# Patient Record
Sex: Female | Born: 1995 | Race: White | Hispanic: No | Smoking: Current every day smoker
Health system: Southern US, Community
[De-identification: ages and names within clinical notes are randomized; demographics above are authoritative.]

## PROBLEM LIST (undated history)

## (undated) DIAGNOSIS — R45851 Suicidal ideations: Secondary | ICD-10-CM

## (undated) DIAGNOSIS — F4010 Social phobia, unspecified: Secondary | ICD-10-CM

## (undated) DIAGNOSIS — F419 Anxiety disorder, unspecified: Secondary | ICD-10-CM

## (undated) DIAGNOSIS — Z8659 Personal history of other mental and behavioral disorders: Secondary | ICD-10-CM

## (undated) DIAGNOSIS — F4310 Post-traumatic stress disorder, unspecified: Secondary | ICD-10-CM

## (undated) HISTORY — PX: OTHER SURGICAL HISTORY: SHX169

## (undated) HISTORY — PX: TONSILLECTOMY: SUR1361

---
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
## 2016-01-04 ENCOUNTER — Ambulatory Visit (INDEPENDENT_AMBULATORY_CARE_PROVIDER_SITE_OTHER): Payer: Medicaid Other | Admitting: Psychiatry

## 2016-01-04 ENCOUNTER — Encounter (HOSPITAL_COMMUNITY): Payer: Self-pay | Admitting: Psychiatry

## 2016-01-04 VITALS — BP 115/66 | HR 73 | Ht 62.0 in | Wt 156.0 lb

## 2016-01-04 DIAGNOSIS — F4310 Post-traumatic stress disorder, unspecified: Secondary | ICD-10-CM

## 2016-01-04 DIAGNOSIS — Z813 Family history of other psychoactive substance abuse and dependence: Secondary | ICD-10-CM | POA: Diagnosis not present

## 2016-01-04 DIAGNOSIS — Z811 Family history of alcohol abuse and dependence: Secondary | ICD-10-CM | POA: Diagnosis not present

## 2016-01-04 DIAGNOSIS — Z79899 Other long term (current) drug therapy: Secondary | ICD-10-CM

## 2016-01-04 DIAGNOSIS — Z818 Family history of other mental and behavioral disorders: Secondary | ICD-10-CM | POA: Diagnosis not present

## 2016-01-04 DIAGNOSIS — F332 Major depressive disorder, recurrent severe without psychotic features: Secondary | ICD-10-CM | POA: Diagnosis not present

## 2016-01-04 MED ORDER — ARIPIPRAZOLE 2 MG PO TABS: 2.0000 mg | ORAL_TABLET | Freq: Every day | ORAL | 2 refills | Status: DC

## 2016-01-04 MED ORDER — SERTRALINE HCL 100 MG PO TABS: ORAL_TABLET | ORAL | 2 refills | Status: DC
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
## 2016-02-03 ENCOUNTER — Ambulatory Visit (HOSPITAL_COMMUNITY): Payer: Self-pay | Admitting: Psychiatry

## 2016-08-09 ENCOUNTER — Encounter (HOSPITAL_COMMUNITY): Payer: Self-pay | Admitting: Psychiatry

## 2016-08-09 ENCOUNTER — Telehealth (HOSPITAL_COMMUNITY): Payer: Self-pay | Admitting: *Deleted

## 2016-08-09 ENCOUNTER — Ambulatory Visit (INDEPENDENT_AMBULATORY_CARE_PROVIDER_SITE_OTHER): Payer: Medicaid Other | Admitting: Psychiatry

## 2016-08-09 VITALS — BP 108/62 | HR 64 | Resp 15 | Wt 149.0 lb

## 2016-08-09 DIAGNOSIS — F332 Major depressive disorder, recurrent severe without psychotic features: Secondary | ICD-10-CM

## 2016-08-09 DIAGNOSIS — Z818 Family history of other mental and behavioral disorders: Secondary | ICD-10-CM

## 2016-08-09 DIAGNOSIS — Z811 Family history of alcohol abuse and dependence: Secondary | ICD-10-CM

## 2016-08-09 DIAGNOSIS — Z813 Family history of other psychoactive substance abuse and dependence: Secondary | ICD-10-CM

## 2016-08-09 DIAGNOSIS — F4310 Post-traumatic stress disorder, unspecified: Secondary | ICD-10-CM | POA: Diagnosis not present

## 2016-08-09 MED ORDER — SERTRALINE HCL 100 MG PO TABS: ORAL_TABLET | ORAL | 2 refills | Status: DC

## 2016-08-09 MED ORDER — ARIPIPRAZOLE 2 MG PO TABS: 2.0000 mg | ORAL_TABLET | Freq: Every day | ORAL | 2 refills | Status: DC
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
## 2016-08-23 ENCOUNTER — Encounter (HOSPITAL_COMMUNITY): Payer: Self-pay | Admitting: Licensed Clinical Social Worker

## 2016-08-23 ENCOUNTER — Ambulatory Visit (INDEPENDENT_AMBULATORY_CARE_PROVIDER_SITE_OTHER): Payer: Medicaid Other | Admitting: Licensed Clinical Social Worker

## 2016-08-23 DIAGNOSIS — F4001 Agoraphobia with panic disorder: Secondary | ICD-10-CM | POA: Diagnosis not present

## 2016-08-23 DIAGNOSIS — F3130 Bipolar disorder, current episode depressed, mild or moderate severity, unspecified: Secondary | ICD-10-CM | POA: Diagnosis not present
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
## 2016-09-06 ENCOUNTER — Ambulatory Visit (INDEPENDENT_AMBULATORY_CARE_PROVIDER_SITE_OTHER): Payer: Medicaid Other | Admitting: Psychiatry

## 2016-09-06 ENCOUNTER — Encounter (HOSPITAL_COMMUNITY): Payer: Self-pay | Admitting: Psychiatry

## 2016-09-06 VITALS — BP 120/64 | HR 70 | Ht 62.0 in | Wt 159.8 lb

## 2016-09-06 DIAGNOSIS — F419 Anxiety disorder, unspecified: Secondary | ICD-10-CM

## 2016-09-06 DIAGNOSIS — Z811 Family history of alcohol abuse and dependence: Secondary | ICD-10-CM

## 2016-09-06 DIAGNOSIS — Z62811 Personal history of psychological abuse in childhood: Secondary | ICD-10-CM | POA: Diagnosis not present

## 2016-09-06 DIAGNOSIS — F332 Major depressive disorder, recurrent severe without psychotic features: Secondary | ICD-10-CM

## 2016-09-06 DIAGNOSIS — Z818 Family history of other mental and behavioral disorders: Secondary | ICD-10-CM

## 2016-09-06 DIAGNOSIS — F3130 Bipolar disorder, current episode depressed, mild or moderate severity, unspecified: Secondary | ICD-10-CM

## 2016-09-06 DIAGNOSIS — F4310 Post-traumatic stress disorder, unspecified: Secondary | ICD-10-CM

## 2016-09-06 DIAGNOSIS — Z813 Family history of other psychoactive substance abuse and dependence: Secondary | ICD-10-CM

## 2016-09-06 MED ORDER — ARIPIPRAZOLE 2 MG PO TABS: 2.0000 mg | ORAL_TABLET | Freq: Every day | ORAL | 2 refills | Status: DC

## 2016-09-06 MED ORDER — SERTRALINE HCL 100 MG PO TABS: ORAL_TABLET | ORAL | 2 refills | Status: DC
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
## 2016-09-15 ENCOUNTER — Ambulatory Visit (INDEPENDENT_AMBULATORY_CARE_PROVIDER_SITE_OTHER): Payer: Medicaid Other | Admitting: Licensed Clinical Social Worker

## 2016-09-15 DIAGNOSIS — F4001 Agoraphobia with panic disorder: Secondary | ICD-10-CM | POA: Diagnosis not present

## 2016-09-15 NOTE — Progress Notes (Signed)
   THERAPIST PROGRESS NOTE  Session Time: 11:00 am- 11:45 am  Participation Level: Active  Behavioral Response: CasualAlertAnxious  Type of Therapy: Family Therapy  Treatment Goals addressed: Anxiety  Interventions: CBT and Solution Focused  Summary: Kimberly Kidd is a 21 y.o. female who presents with Patient is a 21 year old Caucasian female that presents oriented x5 (person, place, situation, time and object), alert, anxious, casually dressed, well groomed, and cooperative to address mood and anxiety. Patient has limited medical history. She has a history of mental health treatment including outpatient therapy, medication management and hospitalization. Patient admits to several symptoms of mania inlcuding irritability, racing thoughts, increased sexual drive during highs, increased euphoria during highs, and increased confidence during her highs. Patient also reported a family history of Bipolar Disorder.  Patient denies current thoughts of suicide but admits to previous thoughts and attempts. She also admits to past self injurious behavior. Patient denies homicidal ideations. She denies psychosis inlcuding auditory and visual hallucinations. Patient denies substance abuse. She is at low risk for lethality at this time.  Patient had an average score of 3 out of 10 on the Outcome Rating Scale. Patient reported that her mood fluctuates and her anxiety has increased.  Patient reported that she doesn't like being around people but could not identify a reason. Patient had little insight into her anxiety or mood. After discussion, patient understood the connection between losing her father and 2 grandfathers in the same year when she was in 3rd grade to her current anxiety. Patient shared that she was at school and was called out of class to be told about her father. After that when got called of class she was really worried that something bad happened. Patient noted that she feels anxious being  away from her grandmother due to worry that something bad will happen to her. Patient could not identify what she is actually worried will happen to her grandmother. Patient reported that she tries to distract herself and count to 20 to deal with anxiety. After discussion, patient agreed to use deep breathing to reduce her anxiety. Patient committed to use deep breathing to reduce anxiety and pay attention to her thoughts that cause anxiety. Patient rated the session 7.5 out of 10 on the Session Rating Scale.   Patient engaged in session. Patient responded well to interventions. Patient continues to meet criteria for Bipolar I disorder, most recent episode depressed. Patient will continue in outpatient therapy due to being the least restrictive service to meet her needs. Patient made no progress on her goals at this time.   Suicidal/Homicidal: Negativewithout intent/plan  Therapist Response: Therapist reviewed patient's recent thoughts and behaviors. Therapist utilized CBT to address mood and anxiety. Therapist explored the roots of patient's anxiety. Therapist connected grief and loss that patient experienced to her current anxiety. Therapist had patient identify what helps her manage her anxiety. Therapist taught patient deep breathing to reduce anxiety. Therapist committed patient to use deep breathing to reduce anxiety and pay attention to thoughts that cause anxiety. Therapist administered the Outcome Rating Scale and the Session Rating Scale.   Plan: Return again in 2 weeks. Therapist will review patient goals on or before 10.25.2018.  Diagnosis: Axis I: Bipolar, Depressed and Panic Disorder    Axis II: No diagnosis    Bynum Bellows, LCSW 09/15/2016

## 2016-10-04 ENCOUNTER — Ambulatory Visit (INDEPENDENT_AMBULATORY_CARE_PROVIDER_SITE_OTHER): Payer: Medicaid Other | Admitting: Licensed Clinical Social Worker

## 2016-10-04 DIAGNOSIS — F3130 Bipolar disorder, current episode depressed, mild or moderate severity, unspecified: Secondary | ICD-10-CM

## 2016-10-04 DIAGNOSIS — F410 Panic disorder [episodic paroxysmal anxiety] without agoraphobia: Secondary | ICD-10-CM

## 2016-10-04 NOTE — Progress Notes (Signed)
   THERAPIST PROGRESS NOTE  Session Time: 11:00 am- 11:45 am  Participation Level: Active  Behavioral Response: CasualAlertAnxious  Type of Therapy: Family Therapy  Treatment Goals addressed: Anxiety  Interventions: CBT and Solution Focused  Summary: Kimberly BillingsKristen Kidd is a 21 y.o. female who presents with Patient is a 21 year old Caucasian female that presents oriented x5 (person, place, situation, time and object), alert, anxious, casually dressed, well groomed, and cooperative to address mood and anxiety. Patient has limited medical history. She has a history of mental health treatment including outpatient therapy, medication management and hospitalization. Patient admits to several symptoms of mania inlcuding irritability, racing thoughts, increased sexual drive during highs, increased euphoria during highs, and increased confidence during her highs. Patient also reported a family history of Bipolar Disorder.  Patient denies current thoughts of suicide but admits to previous thoughts and attempts. She also admits to past self injurious behavior. Patient denies homicidal ideations. She denies psychosis inlcuding auditory and visual hallucinations. Patient denies substance abuse. She is at low risk for lethality at this time.  Patient had an average score of 2.5 out of 10 on the Outcome Rating Scale. Patient reported that she has been experiencing an increase of anxiety and depression. Patient reported that her family, finances, situation and depression are triggers for her. Patient described what a good day looks like for her including showering, going for a walk, spending time in the woods, read and not stay in bed. After discussion, patient understood that she can only control her behaviors, she can't current control the finances, and she can't control her family.  Patient committed to manage her hygiene daily, go for a walk 3 times a week, [facility] [employer] to get new books to read and not stay  in bed. Patient rated the session 5.5 out of 10 on the Session Rating Scale which is a reduction of 2. Patient provided no feedback to what could be improved.   Patient engaged in session. Patient responded well to interventions. Patient continues to meet criteria for Bipolar I disorder, most recent episode depressed. Patient will continue in outpatient therapy due to being the least restrictive service to meet her needs. Patient made minimal progress on her goals at this time.   Suicidal/Homicidal: Negativewithout intent/plan  Therapist Response: Therapist reviewed patient's recent thoughts and behaviors. Therapist utilized CBT to address mood and anxiety. Therapist processed patient's feelings and increase in anxiety and depression to identify triggers.Therapist committed patient to manage her hygiene daily, go for a walk 3 times a week, go [facility] [employer] to get new books to read and not stay in bed. Therapist administered the Outcome Rating Scale and the Session Rating Scale.   Plan: Return again in 3 weeks. Therapist will review patient goals on or before 10.25.2018.  Diagnosis: Axis I: Bipolar, Depressed and Panic Disorder    Axis II: No diagnosis    Bynum BellowsJoshua Atthew Coutant, LCSW 10/04/2016

## 2016-10-12 ENCOUNTER — Ambulatory Visit: Payer: Self-pay | Admitting: Family Medicine

## 2016-10-17 ENCOUNTER — Ambulatory Visit (HOSPITAL_COMMUNITY): Payer: Medicaid Other | Admitting: Psychiatry

## 2016-10-25 ENCOUNTER — Ambulatory Visit (HOSPITAL_COMMUNITY): Payer: Medicaid Other | Admitting: Licensed Clinical Social Worker

## 2017-01-28 ENCOUNTER — Encounter (HOSPITAL_COMMUNITY): Payer: Self-pay

## 2017-01-28 ENCOUNTER — Emergency Department (HOSPITAL_COMMUNITY)
Admission: EM | Admit: 2017-01-28 | Discharge: 2017-01-28 | Disposition: A | Discharge status: Home / Self Care | Payer: Medicaid Other | Attending: Emergency Medicine | Admitting: Emergency Medicine

## 2017-01-28 ENCOUNTER — Other Ambulatory Visit: Payer: Self-pay

## 2017-01-28 ENCOUNTER — Emergency Department (HOSPITAL_COMMUNITY): Payer: Medicaid Other

## 2017-01-28 DIAGNOSIS — K5900 Constipation, unspecified: Secondary | ICD-10-CM | POA: Diagnosis not present

## 2017-01-28 DIAGNOSIS — R109 Unspecified abdominal pain: Secondary | ICD-10-CM | POA: Diagnosis present

## 2017-01-28 DIAGNOSIS — J45909 Unspecified asthma, uncomplicated: Secondary | ICD-10-CM | POA: Diagnosis not present

## 2017-01-28 LAB — CBC WITH DIFFERENTIAL/PLATELET
BASOS ABS: 0.1 10*3/uL (ref 0.0–0.1)
BASOS PCT: 1 %
EOS PCT: 1 %
Eosinophils Absolute: 0.1 10*3/uL (ref 0.0–0.7)
HCT: 44.4 % (ref 36.0–46.0)
Hemoglobin: 14.6 g/dL (ref 12.0–15.0)
Lymphocytes Relative: 34 %
Lymphs Abs: 2.3 10*3/uL (ref 0.7–4.0)
MCH: 28.5 pg (ref 26.0–34.0)
MCHC: 32.9 g/dL (ref 30.0–36.0)
MCV: 86.5 fL (ref 78.0–100.0)
MONO ABS: 0.6 10*3/uL (ref 0.1–1.0)
Monocytes Relative: 9 %
Neutro Abs: 3.8 10*3/uL (ref 1.7–7.7)
Neutrophils Relative %: 55 %
PLATELETS: 290 10*3/uL (ref 150–400)
RBC: 5.13 MIL/uL — ABNORMAL HIGH (ref 3.87–5.11)
RDW: 12.8 % (ref 11.5–15.5)
WBC: 6.8 10*3/uL (ref 4.0–10.5)

## 2017-01-28 LAB — URINALYSIS, ROUTINE W REFLEX MICROSCOPIC
BILIRUBIN URINE: NEGATIVE
Glucose, UA: NEGATIVE mg/dL
Hgb urine dipstick: NEGATIVE
Ketones, ur: NEGATIVE mg/dL
Leukocytes, UA: NEGATIVE
Nitrite: NEGATIVE
PROTEIN: NEGATIVE mg/dL
Specific Gravity, Urine: 1.01 (ref 1.005–1.030)
pH: 5 (ref 5.0–8.0)

## 2017-01-28 LAB — COMPREHENSIVE METABOLIC PANEL
ALBUMIN: 4.4 g/dL (ref 3.5–5.0)
ALT: 14 U/L (ref 14–54)
AST: 17 U/L (ref 15–41)
Alkaline Phosphatase: 51 U/L (ref 38–126)
Anion gap: 11 (ref 5–15)
BUN: 11 mg/dL (ref 6–20)
CHLORIDE: 105 mmol/L (ref 101–111)
CO2: 24 mmol/L (ref 22–32)
Calcium: 9.4 mg/dL (ref 8.9–10.3)
Creatinine, Ser: 0.64 mg/dL (ref 0.44–1.00)
GFR calc Af Amer: >60 mL/min (ref >60)
Glucose, Bld: 88 mg/dL (ref 65–99)
POTASSIUM: 3.9 mmol/L (ref 3.5–5.1)
Sodium: 140 mmol/L (ref 135–145)
Total Bilirubin: 1 mg/dL (ref 0.3–1.2)
Total Protein: 7.9 g/dL (ref 6.5–8.1)

## 2017-01-28 LAB — LIPASE, BLOOD: LIPASE: 29 U/L (ref 11–51)

## 2017-01-28 LAB — PREGNANCY, URINE: Preg Test, Ur: NEGATIVE

## 2017-01-28 MED ORDER — DICYCLOMINE HCL 20 MG PO TABS: 20.0000 mg | ORAL_TABLET | Freq: Two times a day (BID) | ORAL | 0 refills | Status: DC | PRN

## 2017-01-28 MED ORDER — POLYETHYLENE GLYCOL 3350 17 G PO PACK: 17.0000 g | PACK | Freq: Every day | ORAL | 0 refills | Status: DC

## 2017-01-28 MED ORDER — DOCUSATE SODIUM 100 MG PO CAPS: 100.0000 mg | ORAL_CAPSULE | Freq: Every day | ORAL | 0 refills | Status: DC

## 2017-01-28 NOTE — ED Triage Notes (Signed)
Patient complains of lower abdominal cramping since October but worse today. Also reports of spotting blood in November but no regular period since October 25th. Has taken 3 home pregnancy that were negative.

## 2017-01-28 NOTE — ED Provider Notes (Signed)
[facility] EMERGENCY DEPARTMENT Provider Note   CSN: [id number] Arrival date & time: 01/28/17  1317     History   Chief Complaint Chief Complaint  Patient presents with  . Abdominal Pain    HPI Kimberly Kidd is a 21 y.o. female.  HPI Patient presents with several months of episodic abdominal cramping.  Worse in the last few days.  Patient also states she has had difficulty having bowel movements.  She has not had a regular.  Since October.  She did have some spotting in November.  Denies current vaginal bleeding or discharge.  Has taken several home pregnancy tests that were negative.  Denies any nausea or vomiting.  No fever or chills. Past Medical History:  Diagnosis Date  . Anxiety   . Asthma   . Hx of eating disorder 11/06/2014  . Post traumatic stress disorder (PTSD) 11/06/2014  . Social anxiety disorder 11/06/2014  . Suicidal ideation 11/06/2014    Patient Active Problem List   Diagnosis Date Noted  . MDD (major depressive disorder), recurrent episode, severe (HCC) 11/06/2014  . Social anxiety disorder 11/06/2014  . Post traumatic stress disorder (PTSD) 11/06/2014  . Hx of eating disorder 11/06/2014  . Suicidal ideation 11/06/2014  . Recurrent major depression-severe (HCC) 12/23/2010  . Stress disorder, acute 12/23/2010  . Generalized anxiety disorder 12/23/2010    History reviewed. No pertinent surgical history.  OB History    No data available       Home Medications    Prior to Admission medications   Medication Sig Start Date End Date Taking? Authorizing Provider  acetaminophen (TYLENOL) 325 MG tablet Take 650 mg by mouth every 6 (six) hours as needed. For headache    Yes [provider]  ARIPiprazole (ABILIFY) 2 MG tablet Take 1 tablet (2 mg total) by mouth at bedtime. Patient not taking: Reported on 01/28/2017 09/06/16   Myrlene Brokeross, Deborah R, MD  dicyclomine (BENTYL) 20 MG tablet Take 1 tablet (20 mg total) by mouth 2 (two) times daily as needed  for spasms. 01/28/17   Loren RacerYelverton, Morgana Rowley, MD  docusate sodium (COLACE) 100 MG capsule Take 1 capsule (100 mg total) by mouth daily. 01/28/17   Loren RacerYelverton, Evian Salguero, MD  polyethylene glycol [facility](MIRALAX / Ethelene HalGLYCOLAX) packet Take 17 g by mouth daily. 01/28/17   Loren RacerYelverton, Torra Pala, MD    Family History Family History  Problem Relation Age of Onset  . Schizophrenia Maternal Grandfather   . Drug abuse Maternal Grandfather   . Alcohol abuse Maternal Grandfather   . Drug abuse Mother   . Bipolar disorder Mother   . Alcohol abuse Father   . Bipolar disorder Maternal Aunt   . Schizophrenia Maternal Uncle     Social History Social History   Tobacco Use  . Smoking status: Never Smoker  . Smokeless tobacco: Never Used  Substance Use Topics  . Alcohol use: No  . Drug use: No     Allergies   Patient has no known allergies.   Review of Systems Review of Systems  Constitutional: Negative for chills and fever.  Respiratory: Negative for cough and shortness of breath.   Cardiovascular: Negative for chest pain.  Gastrointestinal: Positive for abdominal pain and constipation. Negative for diarrhea, nausea and vomiting.  Genitourinary: Negative for dysuria, flank pain, frequency, hematuria, pelvic pain, vaginal bleeding and vaginal discharge.  Musculoskeletal: Negative for back pain, myalgias, neck pain and neck stiffness.  Skin: Negative for rash and wound.  Neurological: Negative for dizziness, weakness, light-headedness, numbness  and headaches.  All other systems reviewed and are negative.    Physical Exam Updated Vital Signs BP 119/68 (BP Location: Right Arm)   Pulse 96   Temp 98.9 F (37.2 C) (Oral)   Resp 18   Ht 5\' 3"  (1.6 m)   Wt 67.3 kg (148 lb 6 oz)   LMP 11/23/2016   SpO2 100%   BMI 26.28 kg/m   Physical Exam  Constitutional: She is oriented to person, place, and time. She appears well-developed and well-nourished.  Non-toxic appearance. No distress.  HENT:  Head:  Normocephalic and atraumatic.  Mouth/Throat: Oropharynx is clear and moist.  Eyes: EOM are normal. Pupils are equal, round, and reactive to light.  Neck: Normal range of motion. Neck supple.  Cardiovascular: Normal rate and regular rhythm.  Pulmonary/Chest: Effort normal and breath sounds normal.  Abdominal: Soft. Bowel sounds are normal. There is tenderness. There is no rebound and no guarding.  Mild tenderness to palpation in the epigastrum and right lower quadrant.  There is no rebound or guarding.  Musculoskeletal: Normal range of motion. She exhibits no edema or tenderness.  No CVA tenderness bilaterally.  Neurological: She is alert and oriented to person, place, and time.  Moves all extremities without deficit.  Sensation fully intact.  Skin: Skin is warm and dry. Capillary refill takes less than 2 seconds. No rash noted. She is not diaphoretic. No erythema.  Psychiatric: She has a normal mood and affect. Her behavior is normal.  Nursing note and vitals reviewed.    ED Treatments / Results  Labs (all labs ordered are listed, but only abnormal results are displayed) Labs Reviewed  CBC WITH DIFFERENTIAL/PLATELET - Abnormal; Notable for the following components:      Result Value   RBC 5.13 (*)    All other components within normal limits  URINALYSIS, ROUTINE W REFLEX MICROSCOPIC  PREGNANCY, URINE  COMPREHENSIVE METABOLIC PANEL  LIPASE, BLOOD    EKG  EKG Interpretation None       Radiology Dg Abdomen 1 View  Result Date: 01/28/2017 CLINICAL DATA:  Lower abdominal pain for several months. EXAM: ABDOMEN - 1 VIEW COMPARISON:  10/31/2005. FINDINGS: The bowel gas pattern is normal. No radio-opaque calculi or other significant radiographic abnormality are seen. IMPRESSION: Negative. Electronically Signed   By: Kennith CenterEric  Mansell M.D.   On: 01/28/2017 17:29    Procedures Procedures (including critical care time)  Medications Ordered in ED Medications - No data to  display   Initial Impression / Assessment and Plan / ED Course  I have reviewed the triage vital signs and the nursing notes.  Pertinent labs & imaging results that were available during my care of the patient were reviewed by me and considered in my medical decision making (see chart for details).     Abdominal exam is benign.  Low suspicion for appendicitis.  Normal white blood cell count.  X-ray showed moderate amount of stool in the ascending colon.  This is area where she is most tender.  Suspect constipation.  Will start on bowel regimen and have follow-up closely with her primary physician.  Strict return precautions given.  Final Clinical Impressions(s) / ED Diagnoses   Final diagnoses:  Constipation, unspecified constipation type  Abdominal cramping    ED Discharge Orders        Ordered    polyethylene glycol (MIRALAX / GLYCOLAX) packet  Daily     01/28/17 1803    docusate sodium (COLACE) 100 MG capsule  Daily  01/28/17 1803    dicyclomine (BENTYL) 20 MG tablet  2 times daily PRN     01/28/17 1803       Loren Racer, MD 01/28/17 [phone]

## 2017-08-03 ENCOUNTER — Encounter (HOSPITAL_COMMUNITY): Payer: Self-pay | Admitting: *Deleted

## 2017-08-03 ENCOUNTER — Other Ambulatory Visit: Payer: Self-pay

## 2017-08-03 ENCOUNTER — Emergency Department (HOSPITAL_COMMUNITY)
Admission: EM | Admit: 2017-08-03 | Discharge: 2017-08-03 | Disposition: A | Discharge status: Home / Self Care | Payer: Medicaid Other | Attending: Emergency Medicine | Admitting: Emergency Medicine

## 2017-08-03 DIAGNOSIS — N3001 Acute cystitis with hematuria: Secondary | ICD-10-CM

## 2017-08-03 DIAGNOSIS — J45909 Unspecified asthma, uncomplicated: Secondary | ICD-10-CM | POA: Insufficient documentation

## 2017-08-03 DIAGNOSIS — R1084 Generalized abdominal pain: Secondary | ICD-10-CM | POA: Diagnosis present

## 2017-08-03 LAB — URINALYSIS, ROUTINE W REFLEX MICROSCOPIC
BILIRUBIN URINE: NEGATIVE
Bacteria, UA: NONE SEEN
Glucose, UA: NEGATIVE mg/dL
KETONES UR: 5 mg/dL — AB
Nitrite: NEGATIVE
PH: 5 (ref 5.0–8.0)
Protein, ur: NEGATIVE mg/dL
SPECIFIC GRAVITY, URINE: 1.01 (ref 1.005–1.030)

## 2017-08-03 LAB — CBC
HCT: 42.5 % (ref 36.0–46.0)
HEMOGLOBIN: 14.2 g/dL (ref 12.0–15.0)
MCH: 29.1 pg (ref 26.0–34.0)
MCHC: 33.4 g/dL (ref 30.0–36.0)
MCV: 87.1 fL (ref 78.0–100.0)
PLATELETS: 258 10*3/uL (ref 150–400)
RBC: 4.88 MIL/uL (ref 3.87–5.11)
RDW: 12.6 % (ref 11.5–15.5)
WBC: 13.5 10*3/uL — ABNORMAL HIGH (ref 4.0–10.5)

## 2017-08-03 LAB — COMPREHENSIVE METABOLIC PANEL
ALK PHOS: 50 U/L (ref 38–126)
ALT: 12 U/L (ref 0–44)
ANION GAP: 11 (ref 5–15)
AST: 16 U/L (ref 15–41)
Albumin: 4.1 g/dL (ref 3.5–5.0)
BILIRUBIN TOTAL: 1.2 mg/dL (ref 0.3–1.2)
BUN: 10 mg/dL (ref 6–20)
CALCIUM: 9.2 mg/dL (ref 8.9–10.3)
CO2: 24 mmol/L (ref 22–32)
Chloride: 104 mmol/L (ref 98–111)
Creatinine, Ser: 0.71 mg/dL (ref 0.44–1.00)
Glucose, Bld: 86 mg/dL (ref 70–99)
Potassium: 4 mmol/L (ref 3.5–5.1)
Sodium: 139 mmol/L (ref 135–145)
TOTAL PROTEIN: 7.9 g/dL (ref 6.5–8.1)

## 2017-08-03 LAB — LIPASE, BLOOD: Lipase: 25 U/L (ref 11–51)

## 2017-08-03 LAB — HCG, QUANTITATIVE, PREGNANCY: hCG, Beta Chain, Quant, S: <1 m[IU]/mL (ref <5)

## 2017-08-03 MED ORDER — CEPHALEXIN 500 MG PO CAPS: 500.0000 mg | ORAL_CAPSULE | Freq: Four times a day (QID) | ORAL | 0 refills | Status: DC

## 2017-08-03 MED ORDER — SODIUM CHLORIDE 0.9 % IV BOLUS
1000.0000 mL | Freq: Once | INTRAVENOUS | Status: AC
  Administered 2017-08-03: 1000 mL via INTRAVENOUS

## 2017-08-03 MED ORDER — SODIUM CHLORIDE 0.9 % IV SOLN
1.0000 g | Freq: Once | INTRAVENOUS | Status: AC
  Administered 2017-08-03: 1 g via INTRAVENOUS
  Filled 2017-08-03: qty 10

## 2017-08-03 MED ORDER — IBUPROFEN 800 MG PO TABS: 800.0000 mg | ORAL_TABLET | Freq: Three times a day (TID) | ORAL | 0 refills | Status: DC | PRN

## 2017-08-03 MED ORDER — SERTRALINE HCL 100 MG PO TABS: 100.0000 mg | ORAL_TABLET | Freq: Every day | ORAL | 0 refills | Status: DC

## 2017-08-03 NOTE — ED Provider Notes (Signed)
[facility] EMERGENCY DEPARTMENT Provider Note   CSN: [id number] Arrival date & time: 08/03/17  1505     History   Chief Complaint Chief Complaint  Patient presents with  . Abdominal Pain    HPI Kimberly Kidd is a 22 y.o. female.  Patient complains of lower abdominal pain.  She also states she would like to get back on her Zoloft for anxiety  The history is provided by the patient. No language interpreter was used.  Abdominal Pain   This is a new problem. The current episode started 2 days ago. The problem occurs constantly. The problem has not changed since onset.The pain is associated with an unknown factor. The pain is located in the generalized abdominal region. The quality of the pain is aching. The pain is at a severity of 5/10. The pain is moderate. Pertinent negatives include anorexia, diarrhea, frequency, hematuria and headaches. Nothing aggravates the symptoms.    Past Medical History:  Diagnosis Date  . Anxiety   . Asthma   . Hx of eating disorder 11/06/2014  . Post traumatic stress disorder (PTSD) 11/06/2014  . Social anxiety disorder 11/06/2014  . Suicidal ideation 11/06/2014    Patient Active Problem List   Diagnosis Date Noted  . MDD (major depressive disorder), recurrent episode, severe (HCC) 11/06/2014  . Social anxiety disorder 11/06/2014  . Post traumatic stress disorder (PTSD) 11/06/2014  . Hx of eating disorder 11/06/2014  . Suicidal ideation 11/06/2014  . Recurrent major depression-severe (HCC) 12/23/2010  . Stress disorder, acute 12/23/2010  . Generalized anxiety disorder 12/23/2010    History reviewed. No pertinent surgical history.   OB History   None      Home Medications    Prior to Admission medications   Medication Sig Start Date End Date Taking? Authorizing Provider  ARIPiprazole (ABILIFY) 2 MG tablet Take 1 tablet (2 mg total) by mouth at bedtime. Patient not taking: Reported on 01/28/2017 09/06/16   Myrlene Broker, MD    cephALEXin (KEFLEX) 500 MG capsule Take 1 capsule (500 mg total) by mouth 4 (four) times daily. 08/03/17   Bethann Berkshire, MD  ibuprofen (ADVIL,MOTRIN) 800 MG tablet Take 1 tablet (800 mg total) by mouth every 8 (eight) hours as needed for moderate pain. 08/03/17   Bethann Berkshire, MD  sertraline (ZOLOFT) 100 MG tablet Take 1 tablet (100 mg total) by mouth daily. 08/03/17   Bethann Berkshire, MD    Family History Family History  Problem Relation Age of Onset  . Schizophrenia Maternal Grandfather   . Drug abuse Maternal Grandfather   . Alcohol abuse Maternal Grandfather   . Drug abuse Mother   . Bipolar disorder Mother   . Alcohol abuse Father   . Bipolar disorder Maternal Aunt   . Schizophrenia Maternal Uncle     Social History Social History   Tobacco Use  . Smoking status: Never Smoker  . Smokeless tobacco: Never Used  Substance Use Topics  . Alcohol use: No  . Drug use: No    Types: Benzodiazepines     Allergies   Patient has no known allergies.   Review of Systems Review of Systems  Constitutional: Negative for appetite change and fatigue.  HENT: Negative for congestion, ear discharge and sinus pressure.   Eyes: Negative for discharge.  Respiratory: Negative for cough.   Cardiovascular: Negative for chest pain.  Gastrointestinal: Positive for abdominal pain. Negative for anorexia and diarrhea.  Genitourinary: Negative for frequency and hematuria.  Musculoskeletal: Negative for back  pain.  Skin: Negative for rash.  Neurological: Negative for seizures and headaches.  Psychiatric/Behavioral: Negative for hallucinations.     Physical Exam Updated Vital Signs BP 119/74   Pulse (!) 112   Temp 99.4 F (37.4 C)   Resp 20   Ht 5\' 3"  (1.6 m)   Wt 65.8 kg (145 lb)   LMP  (LMP Unknown)   SpO2 98%   BMI 25.69 kg/m   Physical Exam  Constitutional: She is oriented to person, place, and time. She appears well-developed.  HENT:  Head: Normocephalic.  Eyes: Conjunctivae  and EOM are normal. No scleral icterus.  Neck: Neck supple. No thyromegaly present.  Cardiovascular: Normal rate and regular rhythm. Exam reveals no gallop and no friction rub.  No murmur heard. Pulmonary/Chest: No stridor. She has no wheezes. She has no rales. She exhibits no tenderness.  Abdominal: She exhibits no distension. There is tenderness. There is no rebound.  Musculoskeletal: Normal range of motion. She exhibits no edema.  Lymphadenopathy:    She has no cervical adenopathy.  Neurological: She is oriented to person, place, and time. She exhibits normal muscle tone. Coordination normal.  Skin: No rash noted. No erythema.  Psychiatric: She has a normal mood and affect. Her behavior is normal.     ED Treatments / Results  Labs (all labs ordered are listed, but only abnormal results are displayed) Labs Reviewed  CBC - Abnormal; Notable for the following components:      Result Value   WBC 13.5 (*)    All other components within normal limits  URINALYSIS, ROUTINE W REFLEX MICROSCOPIC - Abnormal; Notable for the following components:   APPearance HAZY (*)    Hgb urine dipstick MODERATE (*)    Ketones, ur 5 (*)    Leukocytes, UA SMALL (*)    WBC, UA >50 (*)    All other components within normal limits  URINE CULTURE  LIPASE, BLOOD  COMPREHENSIVE METABOLIC PANEL  HCG, QUANTITATIVE, PREGNANCY    EKG None  Radiology No results found.  Procedures Procedures (including critical care time)  Medications Ordered in ED Medications  sodium chloride 0.9 % bolus 1,000 mL (0 mLs Intravenous Stopped 08/03/17 1759)  cefTRIAXone (ROCEPHIN) 1 g in sodium chloride 0.9 % 100 mL IVPB (0 g Intravenous Stopped 08/03/17 1850)     Initial Impression / Assessment and Plan / ED Course  I have reviewed the triage vital signs and the nursing notes.  Pertinent labs & imaging results that were available during my care of the patient were reviewed by me and considered in my medical decision  making (see chart for details).     Patient's urinalysis is consistent with urinary tract infection.  She will be placed on Keflex and given Zoloft which she was on before.  She will follow-up with OB/GYN next week  Final Clinical Impressions(s) / ED Diagnoses   Final diagnoses:  Acute cystitis with hematuria    ED Discharge Orders        Ordered    cephALEXin (KEFLEX) 500 MG capsule  4 times daily     08/03/17 1910    sertraline (ZOLOFT) 100 MG tablet  Daily     08/03/17 1910    ibuprofen (ADVIL,MOTRIN) 800 MG tablet  Every 8 hours PRN     08/03/17 1911       Bethann BerkshireZammit, Liyat Faulkenberry, MD 08/03/17 1914

## 2017-08-03 NOTE — Discharge Instructions (Addendum)
Follow-up with Dr. Emelda FearFerguson or 1 of his partners next week call for an appointment

## 2017-08-03 NOTE — ED Notes (Signed)
When this nurse called for pt to transport to room, pt was eating food from [employer].  Explained to pt that she is not to have anything to eat or drink til seen and ok'd by [name].  Pt states she hasn't eaten all day.

## 2017-08-03 NOTE — ED Triage Notes (Signed)
Pt with abd pain with N/V and constipation since Tuesday, LBM Tuesday or Wednesday.  Emesis x1 this morning. Unknown of last menses.

## 2017-08-06 LAB — URINE CULTURE

## 2017-08-07 ENCOUNTER — Telehealth: Payer: Self-pay | Admitting: Emergency Medicine

## 2017-08-07 NOTE — Telephone Encounter (Signed)
Post ED Visit - Positive Culture Follow-up  Culture report reviewed by antimicrobial stewardship pharmacist:  []  Kimberly Kidd, Pharm.D. []  Kimberly Kidd, Pharm.D., BCPS AQ-ID []  Kimberly Kidd, Pharm.D., BCPS []  Kimberly Kidd, Pharm.D., BCPS []  Kimberly Kidd, [address]Pharm[address]D., BCPS, AAHIVP []  Kimberly Kidd, Pharm.D., BCPS, AAHIVP []  Kimberly Kidd, PharmD, BCPS []  Kimberly Kidd, PharmD, BCPS [x]  Kimberly Kidd, PharmD, BCPS []  Kimberly Kidd, PharmD  Positive urine culture Treated with cephalexin, organism sensitive to the same and no further patient follow-up is required at this time.  Kimberly Kidd, Kimberly Kidd 08/07/2017, 1:25 PM

## 2017-10-11 ENCOUNTER — Emergency Department (HOSPITAL_COMMUNITY)
Admission: EM | Admit: 2017-10-11 | Discharge: 2017-10-11 | Disposition: A | Discharge status: Home / Self Care | Payer: Medicaid Other | Attending: Emergency Medicine | Admitting: Emergency Medicine

## 2017-10-11 ENCOUNTER — Emergency Department (HOSPITAL_COMMUNITY): Payer: Medicaid Other

## 2017-10-11 ENCOUNTER — Other Ambulatory Visit: Payer: Self-pay

## 2017-10-11 ENCOUNTER — Encounter (HOSPITAL_COMMUNITY): Payer: Self-pay

## 2017-10-11 ENCOUNTER — Inpatient Hospital Stay (HOSPITAL_COMMUNITY)
Admission: AD | Admit: 2017-10-11 | Discharge: 2017-10-15 | DRG: 885 | Disposition: A | Discharge status: Home / Self Care | Payer: Medicaid Other | Source: Intra-hospital | Attending: Psychiatry | Admitting: Psychiatry

## 2017-10-11 DIAGNOSIS — G4700 Insomnia, unspecified: Secondary | ICD-10-CM | POA: Diagnosis not present

## 2017-10-11 DIAGNOSIS — X80XXXA Intentional self-harm by jumping from a high place, initial encounter: Secondary | ICD-10-CM | POA: Diagnosis not present

## 2017-10-11 DIAGNOSIS — Z23 Encounter for immunization: Secondary | ICD-10-CM | POA: Insufficient documentation

## 2017-10-11 DIAGNOSIS — J45909 Unspecified asthma, uncomplicated: Secondary | ICD-10-CM | POA: Insufficient documentation

## 2017-10-11 DIAGNOSIS — Z818 Family history of other mental and behavioral disorders: Secondary | ICD-10-CM | POA: Diagnosis not present

## 2017-10-11 DIAGNOSIS — T1491XA Suicide attempt, initial encounter: Secondary | ICD-10-CM | POA: Diagnosis present

## 2017-10-11 DIAGNOSIS — Z630 Problems in relationship with spouse or partner: Secondary | ICD-10-CM | POA: Diagnosis not present

## 2017-10-11 DIAGNOSIS — Y999 Unspecified external cause status: Secondary | ICD-10-CM | POA: Insufficient documentation

## 2017-10-11 DIAGNOSIS — F1290 Cannabis use, unspecified, uncomplicated: Secondary | ICD-10-CM | POA: Diagnosis not present

## 2017-10-11 DIAGNOSIS — R1030 Lower abdominal pain, unspecified: Secondary | ICD-10-CM | POA: Insufficient documentation

## 2017-10-11 DIAGNOSIS — Z813 Family history of other psychoactive substance abuse and dependence: Secondary | ICD-10-CM | POA: Diagnosis not present

## 2017-10-11 DIAGNOSIS — F4310 Post-traumatic stress disorder, unspecified: Secondary | ICD-10-CM | POA: Diagnosis present

## 2017-10-11 DIAGNOSIS — F419 Anxiety disorder, unspecified: Secondary | ICD-10-CM | POA: Diagnosis not present

## 2017-10-11 DIAGNOSIS — Z8659 Personal history of other mental and behavioral disorders: Secondary | ICD-10-CM | POA: Diagnosis not present

## 2017-10-11 DIAGNOSIS — F332 Major depressive disorder, recurrent severe without psychotic features: Secondary | ICD-10-CM | POA: Insufficient documentation

## 2017-10-11 DIAGNOSIS — X838XXA Intentional self-harm by other specified means, initial encounter: Secondary | ICD-10-CM | POA: Insufficient documentation

## 2017-10-11 DIAGNOSIS — F411 Generalized anxiety disorder: Secondary | ICD-10-CM | POA: Diagnosis present

## 2017-10-11 DIAGNOSIS — Y9389 Activity, other specified: Secondary | ICD-10-CM | POA: Insufficient documentation

## 2017-10-11 DIAGNOSIS — F329 Major depressive disorder, single episode, unspecified: Secondary | ICD-10-CM | POA: Diagnosis present

## 2017-10-11 DIAGNOSIS — Y92410 Unspecified street and highway as the place of occurrence of the external cause: Secondary | ICD-10-CM | POA: Diagnosis not present

## 2017-10-11 DIAGNOSIS — R45851 Suicidal ideations: Secondary | ICD-10-CM | POA: Diagnosis not present

## 2017-10-11 DIAGNOSIS — Z79899 Other long term (current) drug therapy: Secondary | ICD-10-CM

## 2017-10-11 DIAGNOSIS — F17210 Nicotine dependence, cigarettes, uncomplicated: Secondary | ICD-10-CM | POA: Diagnosis present

## 2017-10-11 DIAGNOSIS — Z560 Unemployment, unspecified: Secondary | ICD-10-CM | POA: Diagnosis not present

## 2017-10-11 DIAGNOSIS — Z811 Family history of alcohol abuse and dependence: Secondary | ICD-10-CM | POA: Diagnosis not present

## 2017-10-11 LAB — URINALYSIS, ROUTINE W REFLEX MICROSCOPIC
Bacteria, UA: NONE SEEN
Bilirubin Urine: NEGATIVE
GLUCOSE, UA: NEGATIVE mg/dL
KETONES UR: NEGATIVE mg/dL
NITRITE: NEGATIVE
PH: 5 (ref 5.0–8.0)
PROTEIN: NEGATIVE mg/dL
Specific Gravity, Urine: >1.046 — ABNORMAL HIGH (ref 1.005–1.030)

## 2017-10-11 LAB — COMPREHENSIVE METABOLIC PANEL
ALBUMIN: 4.1 g/dL (ref 3.5–5.0)
ALT: 16 U/L (ref 0–44)
ANION GAP: 7 (ref 5–15)
AST: 21 U/L (ref 15–41)
Alkaline Phosphatase: 47 U/L (ref 38–126)
BUN: 11 mg/dL (ref 6–20)
CHLORIDE: 108 mmol/L (ref 98–111)
CO2: 24 mmol/L (ref 22–32)
Calcium: 9 mg/dL (ref 8.9–10.3)
Creatinine, Ser: 0.85 mg/dL (ref 0.44–1.00)
GFR calc Af Amer: >60 mL/min (ref >60)
Glucose, Bld: 98 mg/dL (ref 70–99)
POTASSIUM: 3.7 mmol/L (ref 3.5–5.1)
Sodium: 139 mmol/L (ref 135–145)
Total Bilirubin: 1.2 mg/dL (ref 0.3–1.2)
Total Protein: 7 g/dL (ref 6.5–8.1)

## 2017-10-11 LAB — CBC WITH DIFFERENTIAL/PLATELET
BASOS PCT: 0 %
Basophils Absolute: 0 10*3/uL (ref 0.0–0.1)
EOS PCT: 0 %
Eosinophils Absolute: 0.1 10*3/uL (ref 0.0–0.7)
HEMATOCRIT: 41.3 % (ref 36.0–46.0)
Hemoglobin: 13.8 g/dL (ref 12.0–15.0)
Lymphocytes Relative: 10 %
Lymphs Abs: 1.2 10*3/uL (ref 0.7–4.0)
MCH: 28.6 pg (ref 26.0–34.0)
MCHC: 33.4 g/dL (ref 30.0–36.0)
MCV: 85.7 fL (ref 78.0–100.0)
MONO ABS: 0.6 10*3/uL (ref 0.1–1.0)
MONOS PCT: 5 %
NEUTROS ABS: 10.6 10*3/uL — AB (ref 1.7–7.7)
Neutrophils Relative %: 85 %
PLATELETS: 257 10*3/uL (ref 150–400)
RBC: 4.82 MIL/uL (ref 3.87–5.11)
RDW: 12.7 % (ref 11.5–15.5)
WBC: 12.4 10*3/uL — ABNORMAL HIGH (ref 4.0–10.5)

## 2017-10-11 LAB — RAPID URINE DRUG SCREEN, HOSP PERFORMED
Amphetamines: NOT DETECTED
BARBITURATES: NOT DETECTED
Benzodiazepines: NOT DETECTED
Cocaine: POSITIVE — AB
Opiates: NOT DETECTED
Tetrahydrocannabinol: NOT DETECTED

## 2017-10-11 LAB — TYPE AND SCREEN
ABO/RH(D): A POS
Antibody Screen: NEGATIVE

## 2017-10-11 LAB — ETHANOL

## 2017-10-11 LAB — PROTIME-INR
INR: 1.1
Prothrombin Time: 14.1 seconds (ref 11.4–15.2)

## 2017-10-11 LAB — I-STAT BETA HCG BLOOD, ED (MC, WL, AP ONLY)

## 2017-10-11 MED ORDER — BACITRACIN ZINC 500 UNIT/GM EX OINT
1.0000 "application " | TOPICAL_OINTMENT | Freq: Two times a day (BID) | CUTANEOUS | Status: DC
  Administered 2017-10-11: 1 via TOPICAL
  Filled 2017-10-11: qty 3.6

## 2017-10-11 MED ORDER — IBUPROFEN 800 MG PO TABS
800.0000 mg | ORAL_TABLET | Freq: Three times a day (TID) | ORAL | Status: DC
  Filled 2017-10-11: qty 1

## 2017-10-11 MED ORDER — TETANUS-DIPHTH-ACELL PERTUSSIS 5-2.5-18.5 LF-MCG/0.5 IM SUSP
0.5000 mL | Freq: Once | INTRAMUSCULAR | Status: AC
  Administered 2017-10-11: 0.5 mL via INTRAMUSCULAR
  Filled 2017-10-11: qty 0.5

## 2017-10-11 MED ORDER — TRAMADOL HCL 50 MG PO TABS
50.0000 mg | ORAL_TABLET | Freq: Two times a day (BID) | ORAL | Status: DC | PRN
  Administered 2017-10-12: 50 mg via ORAL
  Filled 2017-10-11: qty 1

## 2017-10-11 MED ORDER — FENTANYL CITRATE (PF) 100 MCG/2ML IJ SOLN
50.0000 ug | Freq: Once | INTRAMUSCULAR | Status: AC
  Administered 2017-10-11: 50 ug via INTRAVENOUS
  Filled 2017-10-11: qty 2

## 2017-10-11 MED ORDER — IBUPROFEN 800 MG PO TABS
800.0000 mg | ORAL_TABLET | Freq: Three times a day (TID) | ORAL | Status: AC
  Administered 2017-10-12 – 2017-10-14 (×8): 800 mg via ORAL
  Filled 2017-10-11 (×12): qty 1

## 2017-10-11 MED ORDER — IOPAMIDOL (ISOVUE-300) INJECTION 61%
100.0000 mL | Freq: Once | INTRAVENOUS | Status: AC | PRN
  Administered 2017-10-11: 100 mL via INTRAVENOUS

## 2017-10-11 MED ORDER — HYDROCODONE-ACETAMINOPHEN 5-325 MG PO TABS
2.0000 | ORAL_TABLET | Freq: Once | ORAL | Status: AC
  Administered 2017-10-11: 2 via ORAL
  Filled 2017-10-11: qty 2

## 2017-10-11 MED ORDER — BACITRACIN ZINC 500 UNIT/GM EX OINT
1.0000 "application " | TOPICAL_OINTMENT | Freq: Two times a day (BID) | CUTANEOUS | Status: DC
  Administered 2017-10-12 – 2017-10-15 (×6): 1 via TOPICAL
  Filled 2017-10-11 (×2): qty 28.35

## 2017-10-11 NOTE — ED Notes (Signed)
Pt has been accepted at [facility] bed [facility], accepting physician is Dr. Jola Babinskilary.  Waiting for IVC papers to be served before pt can transported to [facility].  CN and Diplomatic Services operational officersecretary are aware.
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
## 2017-10-11 NOTE — ED Notes (Signed)
Reinforced some of the dressings placed today.
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
## 2017-10-11 NOTE — ED Notes (Signed)
Still waiting for IVC paperwork to be served by [facility].
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
## 2017-10-12 ENCOUNTER — Other Ambulatory Visit: Payer: Self-pay

## 2017-10-12 ENCOUNTER — Encounter (HOSPITAL_COMMUNITY): Payer: Self-pay | Admitting: *Deleted

## 2017-10-12 DIAGNOSIS — Z818 Family history of other mental and behavioral disorders: Secondary | ICD-10-CM

## 2017-10-12 DIAGNOSIS — F332 Major depressive disorder, recurrent severe without psychotic features: Principal | ICD-10-CM

## 2017-10-12 DIAGNOSIS — F17210 Nicotine dependence, cigarettes, uncomplicated: Secondary | ICD-10-CM

## 2017-10-12 DIAGNOSIS — F419 Anxiety disorder, unspecified: Secondary | ICD-10-CM

## 2017-10-12 DIAGNOSIS — Z811 Family history of alcohol abuse and dependence: Secondary | ICD-10-CM

## 2017-10-12 DIAGNOSIS — X80XXXA Intentional self-harm by jumping from a high place, initial encounter: Secondary | ICD-10-CM

## 2017-10-12 DIAGNOSIS — Z560 Unemployment, unspecified: Secondary | ICD-10-CM

## 2017-10-12 DIAGNOSIS — Z813 Family history of other psychoactive substance abuse and dependence: Secondary | ICD-10-CM

## 2017-10-12 DIAGNOSIS — G4700 Insomnia, unspecified: Secondary | ICD-10-CM

## 2017-10-12 DIAGNOSIS — T1491XA Suicide attempt, initial encounter: Secondary | ICD-10-CM

## 2017-10-12 DIAGNOSIS — F4310 Post-traumatic stress disorder, unspecified: Secondary | ICD-10-CM

## 2017-10-12 MED ORDER — INFLUENZA VAC SPLIT QUAD 0.5 ML IM SUSY
0.5000 mL | PREFILLED_SYRINGE | INTRAMUSCULAR | Status: AC
  Administered 2017-10-14: 0.5 mL via INTRAMUSCULAR
  Filled 2017-10-12: qty 0.5

## 2017-10-12 MED ORDER — ADULT MULTIVITAMIN W/MINERALS CH
1.0000 | ORAL_TABLET | Freq: Every day | ORAL | Status: DC
  Administered 2017-10-12 – 2017-10-15 (×4): 1 via ORAL
  Filled 2017-10-12 (×7): qty 1

## 2017-10-12 MED ORDER — NICOTINE POLACRILEX 2 MG MT GUM: 2.0000 mg | CHEWING_GUM | OROMUCOSAL | Status: DC | PRN

## 2017-10-12 MED ORDER — SILVER SULFADIAZINE 1 % EX CREA
TOPICAL_CREAM | Freq: Every day | CUTANEOUS | Status: DC
  Administered 2017-10-13 – 2017-10-14 (×2): via TOPICAL
  Filled 2017-10-12: qty 85

## 2017-10-12 MED ORDER — SERTRALINE HCL 50 MG PO TABS
50.0000 mg | ORAL_TABLET | Freq: Every day | ORAL | Status: DC
  Administered 2017-10-12 – 2017-10-15 (×4): 50 mg via ORAL
  Filled 2017-10-12 (×6): qty 1

## 2017-10-12 MED ORDER — ENSURE ENLIVE PO LIQD
237.0000 mL | Freq: Two times a day (BID) | ORAL | Status: DC
  Administered 2017-10-12 – 2017-10-15 (×8): 237 mL via ORAL

## 2017-10-12 MED ORDER — TOPIRAMATE 25 MG PO TABS
50.0000 mg | ORAL_TABLET | Freq: Every day | ORAL | Status: DC
  Administered 2017-10-12 – 2017-10-15 (×4): 50 mg via ORAL
  Filled 2017-10-12 (×6): qty 2
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
## 2017-10-12 NOTE — Tx Team (Signed)
Interdisciplinary Treatment and Diagnostic Plan Update  10/12/2017 Time of Session: 0830AM Kimberly Kidd MRN: [id number]  Principal Diagnosis: MDD, recurrent, severe  Secondary Diagnoses: Active Problems:   MDD (major depressive disorder)   Current Medications:  Current Facility-Administered Medications  Medication Dose Route Frequency Provider Last Rate Last Dose  . bacitracin ointment 1 application  1 application Topical BID Rankin, Shuvon B, NP   1 application at 50/93/26 0810  . feeding supplement (ENSURE ENLIVE) (ENSURE ENLIVE) liquid 237 mL  237 mL Oral BID BM Sharma Covert, MD   237 mL at 10/12/17 0811  . ibuprofen (ADVIL,MOTRIN) tablet 800 mg  800 mg Oral TID Lindon Romp A, NP   800 mg at 10/12/17 0810  . [START ON 10/13/2017] Influenza vac split quadrivalent PF (FLUARIX) injection 0.5 mL  0.5 mL Intramuscular Tomorrow-1000 Mallie Darting Cordie Grice, MD      . multivitamin with minerals tablet 1 tablet  1 tablet Oral Daily Sharma Covert, MD      . nicotine polacrilex (NICORETTE) gum 2 mg  2 mg Oral PRN Sharma Covert, MD      . traMADol Veatrice Bourbon) tablet 50 mg  50 mg Oral Q12H PRN Rozetta Nunnery, NP   50 mg at 10/12/17 0024   PTA Medications: Medications Prior to Admission  Medication Sig Dispense Refill Last Dose  . ARIPiprazole (ABILIFY) 2 MG tablet Take 1 tablet (2 mg total) by mouth at bedtime. (Patient not taking: Reported on 10/11/2017) 30 tablet 2 Not Taking at Unknown time  . cephALEXin (KEFLEX) 500 MG capsule Take 1 capsule (500 mg total) by mouth 4 (four) times daily. (Patient not taking: Reported on 10/11/2017) 28 capsule 0 Completed Course at Unknown time  . ibuprofen (ADVIL,MOTRIN) 800 MG tablet Take 1 tablet (800 mg total) by mouth every 8 (eight) hours as needed for moderate pain. (Patient not taking: Reported on 10/11/2017) 15 tablet 0 Not Taking at Unknown time  . sertraline (ZOLOFT) 100 MG tablet Take 1 tablet (100 mg total) by mouth daily. 30 tablet 0  10/10/2017 at Unknown time    Patient Stressors: Loss of friend Occupational concerns  Patient Strengths: Average or above average intelligence Communication skills General fund of knowledge  Treatment Modalities: Medication Management, Group therapy, Case management,  1 to 1 session with clinician, Psychoeducation, Recreational therapy.   Physician Treatment Plan for Primary Diagnosis:   Medication Management: Evaluate patient's response, side effects, and tolerance of medication regimen.  Therapeutic Interventions: 1 to 1 sessions, Unit Group sessions and Medication administration.  Evaluation of Outcomes: Not Met  Physician Treatment Plan for Secondary Diagnosis: Active Problems:   MDD (major depressive disorder)   Medication Management: Evaluate patient's response, side effects, and tolerance of medication regimen.  Therapeutic Interventions: 1 to 1 sessions, Unit Group sessions and Medication administration.  Evaluation of Outcomes: Not Met   RN Treatment Plan for Primary Diagnosis:MDD, recurrent, severe Long Term Goal(s): Knowledge of disease and therapeutic regimen to maintain health will improve  Short Term Goals: Ability to remain free from injury will improve, Ability to verbalize feelings will improve, Ability to disclose and discuss suicidal ideas and Ability to identify and develop effective coping behaviors will improve  Medication Management: RN will administer medications as ordered by provider, will assess and evaluate patient's response and provide education to patient for prescribed medication. RN will report any adverse and/or side effects to prescribing provider.  Therapeutic Interventions: 1 on 1 counseling sessions, Psychoeducation, Medication administration, Evaluate responses to  treatment, Monitor vital signs and CBGs as ordered, Perform/monitor CIWA, COWS, AIMS and Fall Risk screenings as ordered, Perform wound care treatments as ordered.  Evaluation  of Outcomes: Not Met   LCSW Treatment Plan for Primary Diagnosis: MDD, recurrent, severe Long Term Goal(s): Safe transition to appropriate next level of care at discharge, Engage patient in therapeutic group addressing interpersonal concerns.  Short Term Goals: Engage patient in aftercare planning with referrals and resources, Increase emotional regulation and Facilitate patient progression through stages of change regarding substance use diagnoses and concerns  Therapeutic Interventions: Assess for all discharge needs, 1 to 1 time with Social worker, Explore available resources and support systems, Assess for adequacy in community support network, Educate family and significant other(s) on suicide prevention, Complete Psychosocial Assessment, Interpersonal group therapy.  Evaluation of Outcomes: Not Met   Progress in Treatment: Attending groups: No. New to unit. Continuing to assess.  Participating in groups: No. Taking medication as prescribed: Yes. Toleration medication: Yes. Family/Significant other contact made: No, will contact:  family member/boyfriend if pt consents to collateral contact.  Patient understands diagnosis: Yes. Discussing patient identified problems/goals with staff: Yes. Medical problems stabilized or resolved: Yes. Denies suicidal/homicidal ideation: Yes. Issues/concerns per patient self-inventory: No. Other: n/a   New problem(s) identified: No, Describe:  n/a  New Short Term/Long Term Goal(s): , medication management for mood stabilization; elimination of SI thoughts; development of comprehensive mental wellness/sobriety plan.   Patient Goals:  "To work on my temper."   Discharge Plan or Barriers: CSW assessing for appropriate referrals. [place] pamphlet, [place] [facility] information, and AA/NA information provided to patient for additional community support and resources.   Reason for Continuation of Hospitalization: Anxiety Depression Other; describe  impulsivity/anger issues  Estimated Length of Stay: Monday, 10/15/17  Attendees: Patient: 10/12/2017 8:48 AM  Physician: Dr. Mallie Darting MD; Dr. Nancy Fetter MD 10/12/2017 8:48 AM  Nursing: Opal Sidles RN; Alyssa RN 10/12/2017 8:48 AM  RN Care Manager:x 10/12/2017 8:48 AM  Social Worker: Janice Norrie LCSW 10/12/2017 8:48 AM  Recreational Therapist: x 10/12/2017 8:48 AM  Other: Lindell Spar NP 10/12/2017 8:48 AM  Other:  10/12/2017 8:48 AM  Other: 10/12/2017 8:48 AM    Scribe for Treatment Team: Avelina Laine, LCSW 10/12/2017 8:48 AM
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
## 2017-10-12 NOTE — Progress Notes (Signed)
WOC wound nurse called and talked to the writer, she stated she can not go against the orders the NP has already ordered for the wound care.
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
## 2017-10-12 NOTE — Progress Notes (Signed)
Upon performing skin assessment for pt during admission process, pt was found to have multiple skin abrasions all over her body.  Both legs were bandaged and not disturb during the assessment.  Pt also had a bandage to her R forearm that was not disturbed.  Pt has significant weeping abrasions to both hips with most of her L side involved from her flank down to the bottom of her L hip.  Pt has a round "raw" abrasion to her R hip.  Most of the abrasions are to her arms, sides and legs.  Pt was tearful as she made movements and reported severe pain when she had to move or change positions.
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
## 2017-10-12 NOTE — Progress Notes (Signed)
Patient did attend the evening speaker AA meeting.  
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
## 2017-10-12 NOTE — BHH Suicide Risk Assessment (Signed)
[facility] INPATIENT:  Family/Significant Other Suicide Prevention Education  Suicide Prevention Education:  Education Completed; [place] NickelCarol Cox (pt's grandmother) [phone] has been identified by the patient as the family member/significant other with whom the patient will be residing, and identified as the person(s) who will aid the patient in the event of a mental health crisis (suicidal ideations/suicide attempt).  With written consent from the patient, the family member/significant other has been provided the following suicide prevention education, prior to the and/or following the discharge of the patient.  The suicide prevention education provided includes the following:  Suicide risk factors  Suicide prevention and interventions  National Suicide Hotline telephone number  [facility] assessment telephone number  [facility] Emergency Assistance 911  [facility] and/or Residential Mobile Crisis Unit telephone number  Request made of family/significant other to:  Remove weapons (e.g., guns, rifles, knives), all items previously/currently identified as safety concern.    Remove drugs/medications (over-the-counter, prescriptions, illicit drugs), all items previously/currently identified as a safety concern.  The family member/significant other verbalizes understanding of the suicide prevention education information provided.  The family member/significant other agrees to remove the items of safety concern listed above.  Pt's grandmother shared that pt has had "boughts of depression since she was a child and does really well on medication. Well last fall, she got this new boyfriend and wanted to come off her medication because I think she was embarrassed to be on it. It helped her tremendously." pt's grandmother reports that pt has a history of cutting as well. She will remove any possible weapons/knives prior to pt returning home as a safety precaution. SPE and aftercare  reviewed as well.   Rona RavensHeather S Inigo Lantigua LCSW 10/12/2017, 11:19 AM

## 2017-10-12 NOTE — Consult Note (Signed)
WOC Nurse wound consult note I was able to speak with the dayshift primary care RN, Erskine SquibbJane.  I explained there are orders in the record from an NP for Bacitracin, and that nursing staff should follow those orders.  Erskine SquibbJane explained the patient has these areas covered with gauze.  I have requested that the nursing staff follow the orders previously provided by the NP. Monitor the wound area(s) for worsening of condition such as: Signs/symptoms of infection,  Increase in size,  Development of or worsening of odor, Development of pain, or increased pain at the affected locations.  Notify the medical team if any of these develop.  Thank you for the consult.  Discussed plan of care with the bedside nurse.  WOC nurse will not follow at this time.  Please re-consult the WOC team if needed.  Helmut MusterSherry Amybeth Sieg, RN, MSN, CWOCN, CNS-BC, pager [phone]

## 2017-10-12 NOTE — Progress Notes (Signed)
Pt wound dressing done, pt tolerated well.

## 2017-10-12 NOTE — B[facility] Group Notes (Signed)
LCSW Group Therapy Note  10/12/2017 1:15pm  Type of Therapy and Topic:  Group Therapy:  Feelings around Relapse and Recovery  Participation Level: Minimal   Description of Group:    Patients in this group will discuss emotions they experience before and after a relapse. They will process how experiencing these feelings, or avoidance of experiencing them, relates to having a relapse. Facilitator will guide patients to explore emotions they have related to recovery. Patients will be encouraged to process which emotions are more powerful. They will be guided to discuss the emotional reaction significant others in their lives may have to their relapse or recovery. Patients will be assisted in exploring ways to respond to the emotions of others without this contributing to a relapse.  Therapeutic Goals: 1. Patient will identify two or more emotions that lead to a relapse for them 2. Patient will identify two emotions that result when they relapse 3. Patient will identify two emotions related to recovery 4. Patient will demonstrate ability to communicate their needs through discussion and/or role plays   Summary of Patient Progress:  Belenda CruiseKristin was attentive and engaged during today's processing group. She actively listened as others shared their stories of relapse and recovery. Belenda CruiseKristin chose not to actively participate in discussion.  Therapeutic Modalities:   Cognitive Behavioral Therapy Solution-Focused Therapy Assertiveness Training Relapse Prevention Therapy   Rona RavensHeather S Melinda Gwinner, LCSW 10/12/2017 12:59 PM
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
## 2017-10-12 NOTE — Tx Team (Signed)
Initial Treatment Plan 10/12/2017 1:32 AM Kimberly BillingsKristen Herbig ZOX:[id number]    PATIENT STRESSORS: Loss of friend Occupational concerns   PATIENT STRENGTHS: Average or above average intelligence Communication skills General fund of knowledge   PATIENT IDENTIFIED PROBLEMS: "My temper"   "being emotional"     depression  Substance abuse             DISCHARGE CRITERIA:  Improved stabilization in mood, thinking, and/or behavior Need for constant or close observation no longer present  PRELIMINARY DISCHARGE PLAN: Outpatient therapy  PATIENT/FAMILY INVOLVEMENT: This treatment plan has been presented to and reviewed with the patient, Kimberly BillingsKristen Sassano.  The patient and family have been given the opportunity to ask questions and make suggestions.  Arrie AranChurch, Anirudh Baiz J, [place]RN 10/12/2017, 1:32 AM

## 2017-10-13 DIAGNOSIS — R45851 Suicidal ideations: Secondary | ICD-10-CM

## 2017-10-13 MED ORDER — ACETAMINOPHEN 500 MG PO TABS
1000.0000 mg | ORAL_TABLET | Freq: Three times a day (TID) | ORAL | Status: DC | PRN
  Administered 2017-10-13 – 2017-10-15 (×5): 1000 mg via ORAL
  Filled 2017-10-13 (×5): qty 2

## 2017-10-13 MED ORDER — CEPHALEXIN 500 MG PO CAPS
500.0000 mg | ORAL_CAPSULE | Freq: Three times a day (TID) | ORAL | Status: DC
  Administered 2017-10-13 – 2017-10-15 (×7): 500 mg via ORAL
  Filled 2017-10-13 (×8): qty 1
  Filled 2017-10-13: qty 2
  Filled 2017-10-13 (×4): qty 1

## 2017-10-13 MED ORDER — OXYCODONE HCL 5 MG PO TABS
5.0000 mg | ORAL_TABLET | Freq: Every day | ORAL | Status: DC
  Administered 2017-10-14 – 2017-10-15 (×2): 5 mg via ORAL
  Filled 2017-10-13 (×2): qty 1

## 2017-10-13 MED ORDER — CEPHALEXIN 500 MG PO CAPS
500.0000 mg | ORAL_CAPSULE | Freq: Two times a day (BID) | ORAL | Status: DC
  Administered 2017-10-13: 500 mg via ORAL
  Filled 2017-10-13 (×2): qty 1
  Filled 2017-10-13: qty 2
  Filled 2017-10-13 (×3): qty 1

## 2017-10-13 MED ORDER — OXYCODONE HCL 5 MG PO TABS
20.0000 mg | ORAL_TABLET | Freq: Every day | ORAL | Status: DC
  Administered 2017-10-13: 20 mg via ORAL

## 2017-10-13 MED ORDER — HYDROXYZINE HCL 50 MG PO TABS
50.0000 mg | ORAL_TABLET | Freq: Every evening | ORAL | Status: DC | PRN
  Administered 2017-10-13 – 2017-10-14 (×2): 50 mg via ORAL
  Filled 2017-10-13 (×2): qty 1

## 2017-10-13 MED ORDER — OXYCODONE HCL 5 MG PO TABS
ORAL_TABLET | ORAL | Status: AC
  Filled 2017-10-13: qty 4

## 2017-10-13 MED ORDER — OXYCODONE HCL 5 MG PO TABS: 5.0000 mg | ORAL_TABLET | Freq: Every day | ORAL | Status: DC

## 2017-10-13 NOTE — Progress Notes (Signed)
D: Patient observed up and visible around milieu. Initiating and receiving frequent telephone calls. Patient states "everyone thought I'd be in the bed but it's better for me to move around and go to groups." Patient's affect anxious with congruent mood. Rates her pain at a 5/10. Bandages clean, dry and intact.   A: No medications ordered this evening. No prns requested or needed. Level III obs in place for safety. Emotional support offered. Patient encouraged to complete Suicide Safety Plan before discharge. Encouraged to attend and participate in unit programming.    R: Patient verbalizes understanding of POC. Patient denies SI/HI/AVH and remains safe on level III obs. Will continue to monitor throughout the night.
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
## 2017-10-13 NOTE — [facility] Group Notes (Signed)
[facility] Group Notes:  (Nursing/MHT/Case Management/Adjunct)  Date:  10/13/2017  Time:  9:24 AM  Type of Therapy:  Goals/Orientation Group.  Participation Level:  Active  Participation Quality:  Appropriate  Affect:  Appropriate  Cognitive:  Appropriate  Insight:  Appropriate  Engagement in Group:  Engaged  Modes of Intervention:  Discussion  Summary of Progress/Problems: Pt attended goals/orientation group, pt was receptive.   Jacquelyne BalintForrest, Enslie Sahota Shanta 10/13/2017, 9:24 AM

## 2017-10-13 NOTE — [facility] Group Notes (Signed)
[facility] Group Notes:  (Nursing/MHT/Case Management/Adjunct)  Date:  10/13/2017  Time:  12:24 PM   Type of Therapy:  Psychoeducational Skills  Participation Level:  Active  Participation Quality:  Appropriate  Affect:  Appropriate  Cognitive:  Appropriate  Insight:  Appropriate  Engagement in Group:  Engaged  Modes of Intervention:  Problem-solving  Summary of Progress/Problems; Pt talked about their life experinces   Bethann PunchesJane O Richy Spradley 10/13/2017, 12:24 PM

## 2017-10-13 NOTE — Progress Notes (Signed)
Patient has been observed up in he dayroom coloring and interacting with peers. She attended group. She reports that she needs to work on no being so impulsive. She reports the pain she felt when her dressings were changed on today. Writer informed her of medications ton aid with pain but she reported that she wanted to wait and take something at bedtime. Support given and safety maintained on unit with 15 min checks.

## 2017-10-13 NOTE — Progress Notes (Signed)
Patient was given oxycodone prior to her dressing change due to severity of wound and pain, 10/10.  This was related to Telfa being adhered to the wound despite the use of saline to remove.  New dressing was obtained from the ED, Vaseline gauze, to prevent this in the future.  Oxycodone reduced for prior dressing changes.  Patient is able to ambulate to the dayroom and engage in treatment at this time.  Wound consult in place.  Nanine MeansJamison Patton Rabinovich, PMHNP

## 2017-10-13 NOTE — [facility] Group Notes (Signed)
[facility] Group Notes:  (Nursing)  Date:  10/13/2017  Time: 1:15 Pm Type of Therapy:  Nurse Education  Participation Level:  Active  Participation Quality:  Appropriate  Affect:  Appropriate  Cognitive:  Alert and Appropriate  Insight:  Appropriate  Engagement in Group:  Engaged  Modes of Intervention:  Discussion and Education  Summary of Progress/Problems: Nurse-led group discussed anger management  Shela NevinValerie S Saraann Enneking 10/13/2017, 3:36 PM

## 2017-10-13 NOTE — Progress Notes (Signed)
D.Pt calm and cooperative-friendly during interactions- observed up in milieu, attending group . Pt reporting relief from pain, still ambulating slowly. Dressing change completed this am. Wounds cleaned with NS- Silvadene applied under vaseline dressing and wrapped in gauze.  Pt currently denies SI/HI and AV hallucinations. A. Labs and vitals monitored. Pt compliant with medications. Pt supported emotionally and encouraged to express concerns and ask questions.   R. Pt remains safe with 15 minute checks. Will continue POC.

## 2017-10-13 NOTE — Plan of Care (Signed)
  Problem: Education: Goal: Knowledge of [facility] Education information/materials will improve Outcome: Completed/Met Goal: Verbalization of understanding the information provided will improve Outcome: Completed/Met   

## 2017-10-14 DIAGNOSIS — Z630 Problems in relationship with spouse or partner: Secondary | ICD-10-CM

## 2017-10-14 DIAGNOSIS — F1290 Cannabis use, unspecified, uncomplicated: Secondary | ICD-10-CM

## 2017-10-14 MED ORDER — IBUPROFEN 800 MG PO TABS
800.0000 mg | ORAL_TABLET | Freq: Three times a day (TID) | ORAL | Status: DC
  Administered 2017-10-15 (×2): 800 mg via ORAL
  Filled 2017-10-14 (×7): qty 1

## 2017-10-14 MED ORDER — HYDROXYZINE HCL 50 MG PO TABS
50.0000 mg | ORAL_TABLET | Freq: Four times a day (QID) | ORAL | Status: DC | PRN
  Administered 2017-10-14 – 2017-10-15 (×2): 50 mg via ORAL
  Filled 2017-10-14: qty 1

## 2017-10-14 MED ORDER — SILVER SULFADIAZINE 1 % EX CREA
TOPICAL_CREAM | Freq: Every day | CUTANEOUS | Status: DC
  Administered 2017-10-15: 08:00:00 via TOPICAL
  Filled 2017-10-14: qty 85
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
## 2017-10-14 NOTE — [facility] Group Notes (Signed)
[facility] LCSW Group Therapy Note  Date/Time:  10/14/2017 9:00-10:00 or 10:00-11:00AM  Type of Therapy and Topic:  Group Therapy:  Healthy and Unhealthy Supports  Participation Level:  Did Not Attend   Description of Group:  Patients in this group were introduced to the idea of adding a variety of healthy supports to address the various needs in their lives.Patients discussed what additional healthy supports could be helpful in their recovery and wellness after discharge in order to prevent future hospitalizations.   An emphasis was placed on using counselor, doctor, therapy groups, 12-step groups, and problem-specific support groups to expand supports.  They also worked as a group on developing a specific plan for several patients to deal with unhealthy supports through boundary-setting, psychoeducation with loved ones, and even termination of relationships.   Therapeutic Goals:   1)  discuss importance of adding supports to stay well once out of the hospital  2)  compare healthy versus unhealthy supports and identify some examples of each  3)  generate ideas and descriptions of healthy supports that can be added  4)  offer mutual support about how to address unhealthy supports  5)  encourage active participation in and adherence to discharge plan    Summary of Patient Progress:   Did not attend Therapeutic Modalities:   Motivational Interviewing Brief Solution-Focused Therapy  Katheryne Gorr D Demontay Grantham         

## 2017-10-14 NOTE — [facility] Group Notes (Signed)
Pt is getting her dressing changed and is unable to attend therapeutic relaxation group.

## 2017-10-14 NOTE — Progress Notes (Signed)
Patient ID: Kimberly BillingsKristen Nhan, female   DOB: [birth year], 22 y.o.   MRN: [id number] D: Pt is calm and cooperative, complains of generalized body pain and aches, and denies SI/HI/AVH.  Pt is reclusive to her room, and is in bed, reports that her sleep quality last night was fair, reports a fair appetite, and affect is blunted, with a depressed mood. Pt states she prefers for her dressing changes to be completed after lunch.  A: Pt being given all meds as scheduled, medicated with Ibuprofen 800mg  for generalized body aches, and is currently resting quietly in bed with no signs/symptoms of distress.  Q15 minute checks are in place for safety.  R: Will continue to monitor pt on Q15 minute safety checks.

## 2017-10-14 NOTE — [facility] Group Notes (Signed)
Pt was invited but did not attend orientation/goals group. 
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
## 2017-10-14 NOTE — Progress Notes (Signed)
Writer has observed patient up in the dayroom interacting with peers. She attended group and remained in dayroom until it closed for the night. She received a pitcher of gatorade and was encouraged to drink it to keep herself hydrated. Support given and safety maintained on unit with 15 min checks,

## 2017-10-14 NOTE — Progress Notes (Signed)
Patient ID: Kimberly BillingsKristen Deal, female   DOB: 1995/10/02, 22 y.o.   MRN: [id number] [facility] Group Notes:  (Nursing/MHT/Case Management/Adjunct)  Date:  10/14/2017  Time:  3:16 PM  Type of Therapy:  Played a non-competitive learning/communication board game that foster learning skills as well as self expression.  Participation Level:  Attended and participated.  Participation Quality:  Good  Affect: WNL  Cognitive:  WNL  Insight:  WNL  Engagement in Group:  WNL  Modes of Intervention:  WNL  Summary of Progress/Problems: Pt participated appropriately during group activity.  Nox Talent 10/14/2017, 3:16 PM

## 2017-10-15 MED ORDER — SERTRALINE HCL 50 MG PO TABS: 50.0000 mg | ORAL_TABLET | Freq: Every day | ORAL | 0 refills | Status: DC

## 2017-10-15 MED ORDER — TOPIRAMATE 50 MG PO TABS: 50.0000 mg | ORAL_TABLET | Freq: Every day | ORAL | 0 refills | Status: DC

## 2017-10-15 MED ORDER — CEPHALEXIN 500 MG PO CAPS: 500.0000 mg | ORAL_CAPSULE | Freq: Three times a day (TID) | ORAL | 0 refills | Status: DC

## 2017-10-15 MED ORDER — HYDROXYZINE HCL 50 MG PO TABS: 50.0000 mg | ORAL_TABLET | Freq: Four times a day (QID) | ORAL | 0 refills | Status: DC | PRN

## 2017-10-15 MED ORDER — NICOTINE POLACRILEX 2 MG MT GUM: 2.0000 mg | CHEWING_GUM | OROMUCOSAL | 0 refills | Status: DC | PRN

## 2017-10-15 NOTE — Progress Notes (Signed)
Patient did attend the evening speaker AA meeting.  

## 2017-10-15 NOTE — Progress Notes (Addendum)
Discharge Note:  Patient discharged home with grandmother.  Patient denied SI and HI.  Denied A/V hallucinations.  Patient stated she is going to ER for wound care.  Suicide prevention information given and discussed with patient, phone numbers and web sites given to patient for suicide information.  Patient [facility]  Patient stated she received all her belongings, clothing, toiletries, misc items, prescriptions, medications, etc.  Survey given to patient who filled out survey and placed in black box.  All required discharge information given to patient at discharge.

## 2017-10-15 NOTE — Progress Notes (Signed)
  [facility] :  Will you be returning to the same living situation after discharge:  Yes,  home At discharge, do you have transportation home?: Yes,  grandmother Do you have the ability to pay for your medications: Yes,  medicaid  Release of information consent forms completed and submitted to medical records by CSW.  Patient to Follow up at: Follow-up Information    [facility] Follow up on 10/18/2017.   Why:  Hospital follow-up on Thursday, 9/19 at 9:00AM. Please bring: photo ID, mediciad card, and hospital discharge paperwork to this appt. Thank you.  Contact information: [address] [place] [id number] [phone]           Next level of care provider has access to [facility] Link:no  Safety Planning and Suicide Prevention discussed: Yes,  SPE completed with pt and her grandmother. SPI pamphlet and Mobile Crisis information provided.   Have you used any form of tobacco in the last 30 days? (Cigarettes, Smokeless Tobacco, Cigars, and/or Pipes): Yes  Has patient been referred to the Quitline?: Patient refused referral  Patient has been referred for addiction treatment: Yes  Rona RavensHeather S Rory Montel, LCSW 10/15/2017, 9:17 AM

## 2017-10-15 NOTE — Progress Notes (Signed)
Recreation Therapy Notes  Date: 9.16.19 Time: 0930 Location: [address] Dayroom  Group Topic: Stress Management  Goal Area(s) Addresses:  Patient will verbalize importance of using healthy stress management.  Patient will identify positive emotions associated with healthy stress management.   Behavioral Response:  Engaged  Intervention: Stress Management  Activity : Guided Imagery.  LRT introduced the stress management technique of guided imagery.  LRT read a script for a forest meditation.  Patients were to follow along as the script was read to engage in the activity.  Education:  Stress Management, Discharge Planning.   Education Outcome: Acknowledges edcuation/In group clarification offered/Needs additional education  Clinical Observations/Feedback: Pt attended and participated in group.    Caroll RancherMarjette Karma Ansley, LRT/CTRS    Caroll RancherLindsay, Kearney Evitt A 10/15/2017 11:54 AM
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
## 2017-10-15 NOTE — Progress Notes (Signed)
D:  Patient denied SI and HI, contracts for safety.  Denied A/V hallucinations. A:  Medications administered per MD orders.  Emotional support and encouragement given patient. R:  Safety maintained with 15 minute checks. Patient stated she is looking forward to discharge today.  That [facility] her [facility]. Dressings which were applied yesterday were removed today and medications bacitracin and silvadene were applied to patient's arms, legs, buttocks per MD orders.   Patient stated she feels ready for discharge today, that she is not suicidal, that she intends to stay away from people who use drugs and alcohol.  Patient also stated that she intends to talk to community college about financial aid to return to school and have a productive life.

## 2018-01-30 NOTE — L&D Delivery Note (Signed)
LABOR COURSE Patient was admitted for rupture of membranes at noon on 09/11/18. Pitocin was initiated then discontinued due to fetal intolerance. She progressed without further augmentation.  Delivery Note Called to room and patient was complete and pushing, head +3. Head delivered ROT. No nuchal cord present. Shoulder and body delivered in usual fashion. At 0353 a viable female was delivered via Vaginal, Spontaneous (Presentation:ROT; ROA).  Infant with spontaneous cry, placed on mother's abdomen, dried and stimulated. Cord clamped x 2 after one-minute delay, and cut by CNM (declined by patient and her mother). Cord blood drawn. Placenta delivered spontaneously with gentle cord traction. Appears intact. Fundus firm with massage and Pitocin. Labia, perineum, vagina, and cervix inspected with 2nd degree lac.   APGAR: 9,9; weight 3140g .   Cord: 3VC with the following complications:none.   Cord pH: collection not indicated  Anesthesia:  epidural Episiotomy: None Lacerations: shallow second degree perineal Suture Repair: 3.0 Monocryl Q Blood Loss (mL): 405  Mom to postpartum.  Baby to Couplet care / Skin to Skin.   Follow up Visit:   Please schedule this patient for Postpartum visit in: 4 weeks with the following provider: Any provider For C/S patients schedule nurse incision check in weeks 2 weeks: no Low risk pregnancy complicated by: anxiety, depression, hx suicidal ideation and attempts Delivery mode:  SVD Anticipated Birth Control:  POPs PP Procedures needed: None  Schedule Integrated BH visit: yes   Please schedule patient for two week mood check  Mallie Snooks, [facility] 09/12/18 7:26 AM

## 2018-03-19 ENCOUNTER — Encounter: Payer: Self-pay | Admitting: Adult Health

## 2018-03-19 ENCOUNTER — Ambulatory Visit (INDEPENDENT_AMBULATORY_CARE_PROVIDER_SITE_OTHER): Payer: Self-pay | Admitting: Adult Health

## 2018-03-19 VITALS — BP 128/79 | HR 100 | Ht 64.0 in | Wt 142.0 lb

## 2018-03-19 DIAGNOSIS — Z3201 Encounter for pregnancy test, result positive: Secondary | ICD-10-CM | POA: Insufficient documentation

## 2018-03-19 DIAGNOSIS — O3680X0 Pregnancy with inconclusive fetal viability, not applicable or unspecified: Secondary | ICD-10-CM

## 2018-03-19 DIAGNOSIS — Z3490 Encounter for supervision of normal pregnancy, unspecified, unspecified trimester: Secondary | ICD-10-CM

## 2018-03-19 DIAGNOSIS — N926 Irregular menstruation, unspecified: Secondary | ICD-10-CM

## 2018-03-19 LAB — POCT URINE PREGNANCY: Preg Test, Ur: POSITIVE — AB

## 2018-03-19 MED ORDER — PRENATAL PLUS 27-1 MG PO TABS: 1.0000 | ORAL_TABLET | Freq: Every day | ORAL | 12 refills | Status: DC

## 2018-03-19 NOTE — Patient Instructions (Signed)
Second Trimester of Pregnancy  The second trimester is from week 14 through week 27 (month 4 through 6). This is often the time in pregnancy that you feel your best. Often times, morning sickness has lessened or quit. You may have more energy, and you may get hungry more often. Your unborn baby is growing rapidly. At the end of the sixth month, he or she is about 9 inches long and weighs about 1 pounds. You will likely feel the baby move between 18 and 20 weeks of pregnancy. Follow these instructions at home: Medicines  Take over-the-counter and prescription medicines only as told by your doctor. Some medicines are safe and some medicines are not safe during pregnancy.  Take a prenatal vitamin that contains at least 600 micrograms (mcg) of folic acid.  If you have trouble pooping (constipation), take medicine that will make your stool soft (stool softener) if your doctor approves. Eating and drinking   Eat regular, healthy meals.  Avoid raw meat and uncooked cheese.  If you get low calcium from the food you eat, talk to your doctor about taking a daily calcium supplement.  Avoid foods that are high in fat and sugars, such as fried and sweet foods.  If you feel sick to your stomach (nauseous) or throw up (vomit): ? Eat 4 or 5 small meals a day instead of 3 large meals. ? Try eating a few soda crackers. ? Drink liquids between meals instead of during meals.  To prevent constipation: ? Eat foods that are high in fiber, like fresh fruits and vegetables, whole grains, and beans. ? Drink enough fluids to keep your pee (urine) clear or pale yellow. Activity  Exercise only as told by your doctor. Stop exercising if you start to have cramps.  Do not exercise if it is too hot, too humid, or if you are in a place of great height (high altitude).  Avoid heavy lifting.  Wear low-heeled shoes. Sit and stand up straight.  You can continue to have sex unless your doctor tells you not to.  Relieving pain and discomfort  Wear a good support bra if your breasts are tender.  Take warm water baths (sitz baths) to soothe pain or discomfort caused by hemorrhoids. Use hemorrhoid cream if your doctor approves.  Rest with your legs raised if you have leg cramps or low back pain.  If you develop puffy, bulging veins (varicose veins) in your legs: ? Wear support hose or compression stockings as told by your doctor. ? Raise (elevate) your feet for 15 minutes, 3-4 times a day. ? Limit salt in your food. Prenatal care  Write down your questions. Take them to your prenatal visits.  Keep all your prenatal visits as told by your doctor. This is important. Safety  Wear your seat belt when driving.  Make a list of emergency phone numbers, including numbers for family, friends, the hospital, and police and fire departments. General instructions  Ask your doctor about the right foods to eat or for help finding a counselor, if you need these services.  Ask your doctor about local prenatal classes. Begin classes before month 6 of your pregnancy.  Do not use hot tubs, steam rooms, or saunas.  Do not douche or use tampons or scented sanitary pads.  Do not cross your legs for long periods of time.  Visit your dentist if you have not done so. Use a soft toothbrush to brush your teeth. Floss gently.  Avoid all smoking, herbs,   and alcohol. Avoid drugs that are not approved by your doctor.  Do not use any products that contain nicotine or tobacco, such as cigarettes and e-cigarettes. If you need help quitting, ask your doctor.  Avoid cat litter boxes and soil used by cats. These carry germs that can cause birth defects in the baby and can cause a loss of your baby (miscarriage) or stillbirth. Contact a doctor if:  You have mild cramps or pressure in your lower belly.  You have pain when you pee (urinate).  You have bad smelling fluid coming from your vagina.  You continue to feel  sick to your stomach (nauseous), throw up (vomit), or have watery poop (diarrhea).  You have a nagging pain in your belly area.  You feel dizzy. Get help right away if:  You have a fever.  You are leaking fluid from your vagina.  You have spotting or bleeding from your vagina.  You have severe belly cramping or pain.  You lose or gain weight rapidly.  You have trouble catching your breath and have chest pain.  You notice sudden or extreme puffiness (swelling) of your face, hands, ankles, feet, or legs.  You have not felt the baby move in over an hour.  You have severe headaches that do not go away when you take medicine.  You have trouble seeing. Summary  The second trimester is from week 14 through week 27 (months 4 through 6). This is often the time in pregnancy that you feel your best.  To take care of yourself and your unborn baby, you will need to eat healthy meals, take medicines only if your doctor tells you to do so, and do activities that are safe for you and your baby.  Call your doctor if you get sick or if you notice anything unusual about your pregnancy. Also, call your doctor if you need help with the right food to eat, or if you want to know what activities are safe for you. This information is not intended to replace advice given to you by your health care provider. Make sure you discuss any questions you have with your health care provider. Document Released: 04/12/2009 Document Revised: 02/22/2016 Document Reviewed: 02/22/2016 Elsevier Interactive Patient Education  2019 [employer] First Trimester of Pregnancy The first trimester of pregnancy is from week 1 until the end of week 13 (months 1 through 3). A week after a sperm fertilizes an egg, the egg will implant on the wall of the uterus. This embryo will begin to develop into a baby. Genes from you and your partner will form the baby. The female genes will determine whether the baby will be a boy or a girl.  At 6-8 weeks, the eyes and face will be formed, and the heartbeat can be seen on ultrasound. At the end of 12 weeks, all the baby's organs will be formed. Now that you are pregnant, you will want to do everything you can to have a healthy baby. Two of the most important things are to get good prenatal care and to follow your health care provider's instructions. Prenatal care is all the medical care you receive before the baby's birth. This care will help prevent, find, and treat any problems during the pregnancy and childbirth. Body changes during your first trimester Your body goes through many changes during pregnancy. The changes vary from woman to woman.  You may gain or lose a couple of pounds at first.  You may feel sick   to your stomach (nauseous) and you may throw up (vomit). If the vomiting is uncontrollable, call your health care provider.  You may tire easily.  You may develop headaches that can be relieved by medicines. All medicines should be approved by your health care provider.  You may urinate more often. Painful urination may mean you have a bladder infection.  You may develop heartburn as a result of your pregnancy.  You may develop constipation because certain hormones are causing the muscles that push stool through your intestines to slow down.  You may develop hemorrhoids or swollen veins (varicose veins).  Your breasts may begin to grow larger and become tender. Your nipples may stick out more, and the tissue that surrounds them (areola) may become darker.  Your gums may bleed and may be sensitive to brushing and flossing.  Dark spots or blotches (chloasma, mask of pregnancy) may develop on your face. This will likely fade after the baby is born.  Your menstrual periods will stop.  You may have a loss of appetite.  You may develop cravings for certain kinds of food.  You may have changes in your emotions from day to day, such as being excited to be pregnant or  being concerned that something may go wrong with the pregnancy and baby.  You may have more vivid and strange dreams.  You may have changes in your hair. These can include thickening of your hair, rapid growth, and changes in texture. Some women also have hair loss during or after pregnancy, or hair that feels dry or thin. Your hair will most likely return to normal after your baby is born. What to expect at prenatal visits During a routine prenatal visit:  You will be weighed to make sure you and the baby are growing normally.  Your blood pressure will be taken.  Your abdomen will be measured to track your baby's growth.  The fetal heartbeat will be listened to between weeks 10 and 14 of your pregnancy.  Test results from any previous visits will be discussed. Your health care provider may ask you:  How you are feeling.  If you are feeling the baby move.  If you have had any abnormal symptoms, such as leaking fluid, bleeding, severe headaches, or abdominal cramping.  If you are using any tobacco products, including cigarettes, chewing tobacco, and electronic cigarettes.  If you have any questions. Other tests that may be performed during your first trimester include:  Blood tests to find your blood type and to check for the presence of any previous infections. The tests will also be used to check for low iron levels (anemia) and protein on red blood cells (Rh antibodies). Depending on your risk factors, or if you previously had diabetes during pregnancy, you may have tests to check for high blood sugar that affects pregnant women (gestational diabetes).  Urine tests to check for infections, diabetes, or protein in the urine.  An ultrasound to confirm the proper growth and development of the baby.  Fetal screens for spinal cord problems (spina bifida) and Down syndrome.  HIV (human immunodeficiency virus) testing. Routine prenatal testing includes screening for HIV, unless you  choose not to have this test.  You may need other tests to make sure you and the baby are doing well. Follow these instructions at home: Medicines  Follow your health care provider's instructions regarding medicine use. Specific medicines may be either safe or unsafe to take during pregnancy.  Take a prenatal vitamin   that contains at least 600 micrograms (mcg) of folic acid.  If you develop constipation, try taking a stool softener if your health care provider approves. Eating and drinking   Eat a balanced diet that includes fresh fruits and vegetables, whole grains, good sources of protein such as meat, eggs, or tofu, and low-fat dairy. Your health care provider will help you determine the amount of weight gain that is right for you.  Avoid raw meat and uncooked cheese. These carry germs that can cause birth defects in the baby.  Eating four or five small meals rather than three large meals a day may help relieve nausea and vomiting. If you start to feel nauseous, eating a few soda crackers can be helpful. Drinking liquids between meals, instead of during meals, also seems to help ease nausea and vomiting.  Limit foods that are high in fat and processed sugars, such as fried and sweet foods.  To prevent constipation: ? Eat foods that are high in fiber, such as fresh fruits and vegetables, whole grains, and beans. ? Drink enough fluid to keep your urine clear or pale yellow. Activity  Exercise only as directed by your health care provider. Most women can continue their usual exercise routine during pregnancy. Try to exercise for 30 minutes at least 5 days a week. Exercising will help you: ? Control your weight. ? Stay in shape. ? Be prepared for labor and delivery.  Experiencing pain or cramping in the lower abdomen or lower back is a good sign that you should stop exercising. Check with your health care provider before continuing with normal exercises.  Try to avoid standing for  long periods of time. Move your legs often if you must stand in one place for a long time.  Avoid heavy lifting.  Wear low-heeled shoes and practice good posture.  You may continue to have sex unless your health care provider tells you not to. Relieving pain and discomfort  Wear a good support bra to relieve breast tenderness.  Take warm sitz baths to soothe any pain or discomfort caused by hemorrhoids. Use hemorrhoid cream if your health care provider approves.  Rest with your legs elevated if you have leg cramps or low back pain.  If you develop varicose veins in your legs, wear support hose. Elevate your feet for 15 minutes, 3-4 times a day. Limit salt in your diet. Prenatal care  Schedule your prenatal visits by the twelfth week of pregnancy. They are usually scheduled monthly at first, then more often in the last 2 months before delivery.  Write down your questions. Take them to your prenatal visits.  Keep all your prenatal visits as told by your health care provider. This is important. Safety  Wear your seat belt at all times when driving.  Make a list of emergency phone numbers, including numbers for family, friends, the hospital, and police and fire departments. General instructions  Ask your health care provider for a referral to a local prenatal education class. Begin classes no later than the beginning of month 6 of your pregnancy.  Ask for help if you have counseling or nutritional needs during pregnancy. Your health care provider can offer advice or refer you to specialists for help with various needs.  Do not use hot tubs, steam rooms, or saunas.  Do not douche or use tampons or scented sanitary pads.  Do not cross your legs for long periods of time.  Avoid cat litter boxes and soil used by cats.   These carry germs that can cause birth defects in the baby and possibly loss of the fetus by miscarriage or stillbirth.  Avoid all smoking, herbs, alcohol, and  medicines not prescribed by your health care provider. Chemicals in these products affect the formation and growth of the baby.  Do not use any products that contain nicotine or tobacco, such as cigarettes and e-cigarettes. If you need help quitting, ask your health care provider. You may receive counseling support and other resources to help you quit.  Schedule a dentist appointment. At home, brush your teeth with a soft toothbrush and be gentle when you floss. Contact a health care provider if:  You have dizziness.  You have mild pelvic cramps, pelvic pressure, or nagging pain in the abdominal area.  You have persistent nausea, vomiting, or diarrhea.  You have a bad smelling vaginal discharge.  You have pain when you urinate.  You notice increased swelling in your face, hands, legs, or ankles.  You are exposed to fifth disease or chickenpox.  You are exposed to German measles (rubella) and have never had it. Get help right away if:  You have a fever.  You are leaking fluid from your vagina.  You have spotting or bleeding from your vagina.  You have severe abdominal cramping or pain.  You have rapid weight gain or loss.  You vomit blood or material that looks like coffee grounds.  You develop a severe headache.  You have shortness of breath.  You have any kind of trauma, such as from a fall or a car accident. Summary  The first trimester of pregnancy is from week 1 until the end of week 13 (months 1 through 3).  Your body goes through many changes during pregnancy. The changes vary from woman to woman.  You will have routine prenatal visits. During those visits, your health care provider will examine you, discuss any test results you may have, and talk with you about how you are feeling. This information is not intended to replace advice given to you by your health care provider. Make sure you discuss any questions you have with your health care provider. Document  Released: 01/10/2001 Document Revised: 12/29/2015 Document Reviewed: 12/29/2015 Elsevier Interactive Patient Education  2019 Elsevier Inc.  

## 2018-03-19 NOTE — Progress Notes (Signed)
Patient ID: Kimberly Kidd, female   DOB: 12-03-1995, 23 y.o.   MRN: [id number] History of Present Illness: Kimberly Kidd is a 23 year old white female, single in for UPT, has missed periods and had 5+HPTs, has applied for pregnancy medicaid.  PCP is [facility].    Current Medications, Allergies, Past Medical History, Past Surgical History, Family History and Social History were reviewed in [name] record.     Review of Systems: +missed periods with 5+HPTs Has history of irregular periods No bleeding +stomach pain on and off     Physical Exam:BP 128/79 (BP Location: Left Arm, Patient Position: Sitting, Cuff Size: Normal)   Pulse 100   Ht 5\' 4"  (1.626 m)   Wt 142 lb (64.4 kg)   LMP 12/03/2017 (Approximate)   BMI 24.37 kg/m  UPT +, about 15+1 weeks by LMP with EDD 09/10/18. General:  Well developed, well nourished, no acute distress Skin:  Warm and dry Neck:  Midline trachea, normal thyroid, good ROM, no lymphadenopathy Lungs; Clear to auscultation bilaterally Cardiovascular: Regular rate and rhythm Abdomen:  Soft, non tender, FHR 161 via doppler Psych:  No mood changes, alert and cooperative,seems happy Fall risk is low. PHQ 2 score 0.  Impression: 1. Pregnancy examination or test, positive result   2. Pregnancy, unspecified gestational age   41. Encounter to determine fetal viability of pregnancy, single or unspecified fetus       Plan: Meds ordered this encounter  Medications  . prenatal vitamin w/FE, FA (PRENATAL 1 + 1) 27-1 MG TABS tablet    Sig: Take 1 tablet by mouth daily at 12 noon.    Dispense:  30 each    Refill:  12    Order Specific Question:   Supervising Provider    Answer:   Kimberly Kidd [[id number]]  Eat often Return in 1 week for dating US/2 weeks New OB Review handouts on First and Second Trimester and by [facility] and given book Preparing for [employer]

## 2018-04-03 ENCOUNTER — Ambulatory Visit (INDEPENDENT_AMBULATORY_CARE_PROVIDER_SITE_OTHER): Payer: Medicaid Other

## 2018-04-03 DIAGNOSIS — Z363 Encounter for antenatal screening for malformations: Secondary | ICD-10-CM

## 2018-04-03 DIAGNOSIS — Z3A17 17 weeks gestation of pregnancy: Secondary | ICD-10-CM | POA: Diagnosis not present

## 2018-04-03 DIAGNOSIS — O3680X0 Pregnancy with inconclusive fetal viability, not applicable or unspecified: Secondary | ICD-10-CM | POA: Diagnosis not present

## 2018-04-03 NOTE — Progress Notes (Signed)
[place] 17 +2 wks,breech,posterior placenta gr 0,svp of fluid 4.4 cm,normal ovaries bilat,fhr 157 bpm,cx 3.8 cm,EFW 174 g 23%,anatomy complete,no obvious abnormalities EDD 09/09/2018

## 2018-04-11 ENCOUNTER — Ambulatory Visit: Payer: Medicaid Other | Admitting: *Deleted

## 2018-04-11 ENCOUNTER — Other Ambulatory Visit (HOSPITAL_COMMUNITY)
Admission: RE | Admit: 2018-04-11 | Discharge: 2018-04-11 | Disposition: A | Discharge status: Home / Self Care | Payer: Medicaid Other | Source: Ambulatory Visit | Attending: Advanced Practice Midwife | Admitting: Advanced Practice Midwife

## 2018-04-11 ENCOUNTER — Other Ambulatory Visit: Payer: Self-pay

## 2018-04-11 ENCOUNTER — Ambulatory Visit (INDEPENDENT_AMBULATORY_CARE_PROVIDER_SITE_OTHER): Payer: Medicaid Other | Admitting: Advanced Practice Midwife

## 2018-04-11 ENCOUNTER — Encounter: Payer: Self-pay | Admitting: Advanced Practice Midwife

## 2018-04-11 VITALS — BP 115/77 | HR 86 | Wt 143.0 lb

## 2018-04-11 DIAGNOSIS — Z3A18 18 weeks gestation of pregnancy: Secondary | ICD-10-CM | POA: Diagnosis present

## 2018-04-11 DIAGNOSIS — Z3402 Encounter for supervision of normal first pregnancy, second trimester: Secondary | ICD-10-CM

## 2018-04-11 DIAGNOSIS — F1411 Cocaine abuse, in remission: Secondary | ICD-10-CM

## 2018-04-11 DIAGNOSIS — Z331 Pregnant state, incidental: Secondary | ICD-10-CM

## 2018-04-11 DIAGNOSIS — Z124 Encounter for screening for malignant neoplasm of cervix: Secondary | ICD-10-CM | POA: Insufficient documentation

## 2018-04-11 DIAGNOSIS — O0932 Supervision of pregnancy with insufficient antenatal care, second trimester: Secondary | ICD-10-CM

## 2018-04-11 DIAGNOSIS — Z3400 Encounter for supervision of normal first pregnancy, unspecified trimester: Secondary | ICD-10-CM | POA: Insufficient documentation

## 2018-04-11 DIAGNOSIS — Z1379 Encounter for other screening for genetic and chromosomal anomalies: Secondary | ICD-10-CM

## 2018-04-11 DIAGNOSIS — Z1389 Encounter for screening for other disorder: Secondary | ICD-10-CM

## 2018-04-11 LAB — POCT URINALYSIS DIPSTICK OB
GLUCOSE, UA: NEGATIVE
Ketones, UA: NEGATIVE
LEUKOCYTES UA: NEGATIVE
Nitrite, UA: NEGATIVE
PROTEIN: NEGATIVE
RBC UA: NEGATIVE

## 2018-04-11 NOTE — Progress Notes (Signed)
INITIAL OBSTETRICAL VISIT Patient name: Kimberly Kidd MRN [id number]  Date of birth: Dec 16, 1995 Chief Complaint:   Initial Prenatal Visit  History of Present Illness:   Kimberly Kidd is a 23 y.o. G1P0 Caucasian female at [redacted]w[redacted]d by LMP c/w 16 week [place] with an Estimated Date of Delivery: 09/09/18 being seen today for her initial obstetrical visit.   Her obstetrical history is significant for late to care.  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  Patient's last menstrual period was 12/03/2017 (approximate). Last pap never.  Review of Systems:   Pertinent items are noted in HPI Denies cramping/contractions, leakage of fluid, vaginal bleeding, abnormal vaginal discharge w/ itching/odor/irritation, headaches, visual changes, shortness of breath, chest pain, abdominal pain, severe nausea/vomiting, or problems with urination or bowel movements unless otherwise stated above.  Pertinent History Reviewed:  Reviewed past medical,surgical, social, obstetrical and family history.  Reviewed problem list, medications and allergies. OB History  Gravida Para Term Preterm AB Living  1            SAB TAB Ectopic Multiple Live Births               # Outcome Date GA Lbr Len/2nd Weight Sex Delivery Anes PTL Lv  1 Current            Physical Assessment:   Vitals:   04/11/18 1125  BP: 115/77  Pulse: 86  Weight: 143 lb (64.9 kg)  Body mass index is 24.55 kg/m.       Physical Examination:  General appearance - well appearing, and in no distress  Mental status - alert, oriented to person, place, and time  Psych:  She has a normal mood and affect  Skin - warm and dry, normal color, no suspicious lesions noted  Chest - effort normal, all lung fields clear to auscultation bilaterally  Heart - normal rate and regular rhythm  Abdomen - soft, nontender  Extremities:  No swelling or varicosities noted   Pelvic - VULVA: normal appearing vulva with no masses,  tenderness or lesions  VAGINA: normal appearing vagina with normal color and discharge, no lesions  CERVIX: normal appearing cervix without discharge or lesions, no CMT  Thin prep pap is done w/o HR HPV cotesting  Fetal Heart Rate (bpm): 140us via [place]  Results for orders placed or performed in visit on 04/11/18 (from the past 24 hour(s))  POC Urinalysis Dipstick OB   Collection Time: 04/11/18 11:49 AM  Result Value Ref Range   Color, UA     Clarity, UA     Glucose, UA Negative Negative   Bilirubin, UA     Ketones, UA neg    Spec Grav, UA     Blood, UA neg    pH, UA     POC,PROTEIN,UA Negative Negative, Trace, Small (1+), Moderate (2+), Large (3+), 4+   Urobilinogen, UA     Nitrite, UA neg    Leukocytes, UA Negative Negative   Appearance     Odor      Assessment & Plan:  1) Low-Risk Pregnancy G1P0 at [redacted]w[redacted]d with an Estimated Date of Delivery: 09/09/18   2) Initial OB visit  3) late to care  Meds: No orders of the defined types were placed in this encounter.   Initial labs obtained Continue prenatal vitamins Reviewed n/v relief measures and warning s/s to report Reviewed recommended weight gain based on pre-gravid BMI Encouraged well-balanced diet Watched video  for carrier screening/genetic testing:  Genetic Screening discussed Quad Screen: requested Cystic fibrosis screening declined SMA screening declined Fragile X screening declined Ultrasound discussed; fetal survey: results reviewed CCNC completed  Follow-up: Return in about 4 weeks (around 05/09/2018) for LROB.   Orders Placed This Encounter  Procedures  . Urine Culture  . Obstetric Panel, Including HIV  . Urinalysis, Routine w reflex microscopic  . Sickle cell screen  . AFP TETRA  . Pain Management Screening Profile (10S)  . POC Urinalysis Dipstick OB    Jacklyn Shell DNP, CNM 04/11/2018 1:50 PM

## 2018-04-11 NOTE — Patient Instructions (Signed)
Kimberly Kidd, I greatly value your feedback.  If you receive a survey following your visit with [place] today, we appreciate you taking the time to fill it out.  Thanks, Cathie Beams, CNM     Second Trimester of Pregnancy The second trimester is from week 14 through week 27 (months 4 through 6). The second trimester is often a time when you feel your best. Your body has adjusted to being pregnant, and you begin to feel better physically. Usually, morning sickness has lessened or quit completely, you may have more energy, and you may have an increase in appetite. The second trimester is also a time when the fetus is growing rapidly. At the end of the sixth month, the fetus is about 9 inches long and weighs about 1 pounds. You will likely begin to feel the baby move (quickening) between 16 and 20 weeks of pregnancy. Body changes during your second trimester Your body continues to go through many changes during your second trimester. The changes vary from woman to woman.  Your weight will continue to increase. You will notice your lower abdomen bulging out.  You may begin to get stretch marks on your hips, abdomen, and breasts.  You may develop headaches that can be relieved by medicines. The medicines should be approved by your health care provider.  You may urinate more often because the fetus is pressing on your bladder.  You may develop or continue to have heartburn as a result of your pregnancy.  You may develop constipation because certain hormones are causing the muscles that push waste through your intestines to slow down.  You may develop hemorrhoids or swollen, bulging veins (varicose veins).  You may have back pain. This is caused by: ? Weight gain. ? Pregnancy hormones that are relaxing the joints in your pelvis. ? A shift in weight and the muscles that support your balance.  Your breasts will continue to grow and they will continue to become tender.  Your gums may  bleed and may be sensitive to brushing and flossing.  Dark spots or blotches (chloasma, mask of pregnancy) may develop on your face. This will likely fade after the baby is born.  A dark line from your belly button to the pubic area (linea nigra) may appear. This will likely fade after the baby is born.  You may have changes in your hair. These can include thickening of your hair, rapid growth, and changes in texture. Some women also have hair loss during or after pregnancy, or hair that feels dry or thin. Your hair will most likely return to normal after your baby is born.  What to expect at prenatal visits During a routine prenatal visit:  You will be weighed to make sure you and the fetus are growing normally.  Your blood pressure will be taken.  Your abdomen will be measured to track your baby's growth.  The fetal heartbeat will be listened to.  Any test results from the previous visit will be discussed.  Your health care provider may ask you:  How you are feeling.  If you are feeling the baby move.  If you have had any abnormal symptoms, such as leaking fluid, bleeding, severe headaches, or abdominal cramping.  If you are using any tobacco products, including cigarettes, chewing tobacco, and electronic cigarettes.  If you have any questions.  Other tests that may be performed during your second trimester include:  Blood tests that check for: ? Low iron levels (anemia). ? High  blood sugar that affects pregnant women (gestational diabetes) between 28 and 28 weeks. ? Rh antibodies. This is to check for a protein on red blood cells (Rh factor).  Urine tests to check for infections, diabetes, or protein in the urine.  An ultrasound to confirm the proper growth and development of the baby.  An amniocentesis to check for possible genetic problems.  Fetal screens for spina bifida and Down syndrome.  HIV (human immunodeficiency virus) testing. Routine prenatal testing  includes screening for HIV, unless you choose not to have this test.  Follow these instructions at home: Medicines  Follow your health care provider's instructions regarding medicine use. Specific medicines may be either safe or unsafe to take during pregnancy.  Take a prenatal vitamin that contains at least 600 micrograms (mcg) of folic acid.  If you develop constipation, try taking a stool softener if your health care provider approves. Eating and drinking  Eat a balanced diet that includes fresh fruits and vegetables, whole grains, good sources of protein such as meat, eggs, or tofu, and low-fat dairy. Your health care provider will help you determine the amount of weight gain that is right for you.  Avoid raw meat and uncooked cheese. These carry germs that can cause birth defects in the baby.  If you have low calcium intake from food, talk to your health care provider about whether you should take a daily calcium supplement.  Limit foods that are high in fat and processed sugars, such as fried and sweet foods.  To prevent constipation: ? Drink enough fluid to keep your urine clear or pale yellow. ? Eat foods that are high in fiber, such as fresh fruits and vegetables, whole grains, and beans. Activity  Exercise only as directed by your health care provider. Most women can continue their usual exercise routine during pregnancy. Try to exercise for 30 minutes at least 5 days a week. Stop exercising if you experience uterine contractions.  Avoid heavy lifting, wear low heel shoes, and practice good posture.  A sexual relationship may be continued unless your health care provider directs you otherwise. Relieving pain and discomfort  Wear a good support bra to prevent discomfort from breast tenderness.  Take warm sitz baths to soothe any pain or discomfort caused by hemorrhoids. Use hemorrhoid cream if your health care provider approves.  Rest with your legs elevated if you have  leg cramps or low back pain.  If you develop varicose veins, wear support hose. Elevate your feet for 15 minutes, 3-4 times a day. Limit salt in your diet. Prenatal Care  Write down your questions. Take them to your prenatal visits.  Keep all your prenatal visits as told by your health care provider. This is important. Safety  Wear your seat belt at all times when driving.  Make a list of emergency phone numbers, including numbers for family, friends, the hospital, and police and fire departments. General instructions  Ask your health care provider for a referral to a local prenatal education class. Begin classes no later than the beginning of month 6 of your pregnancy.  Ask for help if you have counseling or nutritional needs during pregnancy. Your health care provider can offer advice or refer you to specialists for help with various needs.  Do not use hot tubs, steam rooms, or saunas.  Do not douche or use tampons or scented sanitary pads.  Do not cross your legs for long periods of time.  Avoid cat litter boxes and soil  used by cats. These carry germs that can cause birth defects in the baby and possibly loss of the fetus by miscarriage or stillbirth.  Avoid all smoking, herbs, alcohol, and unprescribed drugs. Chemicals in these products can affect the formation and growth of the baby.  Do not use any products that contain nicotine or tobacco, such as cigarettes and e-cigarettes. If you need help quitting, ask your health care provider.  Visit your dentist if you have not gone yet during your pregnancy. Use a soft toothbrush to brush your teeth and be gentle when you floss. Contact a health care provider if:  You have dizziness.  You have mild pelvic cramps, pelvic pressure, or nagging pain in the abdominal area.  You have persistent nausea, vomiting, or diarrhea.  You have a bad smelling vaginal discharge.  You have pain when you urinate. Get help right away if:  You  have a fever.  You are leaking fluid from your vagina.  You have spotting or bleeding from your vagina.  You have severe abdominal cramping or pain.  You have rapid weight gain or weight loss.  You have shortness of breath with chest pain.  You notice sudden or extreme swelling of your face, hands, ankles, feet, or legs.  You have not felt your baby move in over an hour.  You have severe headaches that do not go away when you take medicine.  You have vision changes. Summary  The second trimester is from week 14 through week 27 (months 4 through 6). It is also a time when the fetus is growing rapidly.  Your body goes through many changes during pregnancy. The changes vary from woman to woman.  Avoid all smoking, herbs, alcohol, and unprescribed drugs. These chemicals affect the formation and growth your baby.  Do not use any tobacco products, such as cigarettes, chewing tobacco, and e-cigarettes. If you need help quitting, ask your health care provider.  Contact your health care provider if you have any questions. Keep all prenatal visits as told by your health care provider. This is important. This information is not intended to replace advice given to you by your health care provider. Make sure you discuss any questions you have with your health care provider.      CHILDBIRTH CLASSES [phone] is the phone number for Pregnancy Classes or hospital tours at [facility] will be referred to  [email or web address] for more information on childbirth classes  At this site you may register for classes. You may sign up for a waiting list if classes are full. Please SIGN UP FOR THIS!.   When the waiting list becomes long, sometimes new classes can be added.

## 2018-04-12 LAB — PMP SCREEN PROFILE (10S), URINE
AMPHETAMINE SCREEN URINE: NEGATIVE ng/mL
BARBITURATE SCREEN URINE: NEGATIVE ng/mL
BENZODIAZEPINE SCREEN, URINE: NEGATIVE ng/mL
CANNABINOIDS UR QL SCN: NEGATIVE ng/mL
Cocaine (Metab) Scrn, Ur: NEGATIVE ng/mL
Creatinine(Crt), U: 129.8 mg/dL (ref 20.0–300.0)
Methadone Screen, Urine: NEGATIVE ng/mL
OPIATE SCREEN URINE: NEGATIVE ng/mL
OXYCODONE+OXYMORPHONE UR QL SCN: NEGATIVE ng/mL
PHENCYCLIDINE QUANTITATIVE URINE: NEGATIVE ng/mL
Ph of Urine: 7.6 (ref 4.5–8.9)
Propoxyphene Scrn, Ur: NEGATIVE ng/mL

## 2018-04-13 LAB — URINE CULTURE

## 2018-04-16 LAB — AFP TETRA
DIA Mom Value: 0.65
DIA VALUE (EIA): 111.67 pg/mL
DSR (BY AGE) 1 IN: 1135
DSR (Second Trimester) 1 IN: 10000
Gestational Age: 18.4 WEEKS
MSAFP Mom: 0.61
MSAFP: 28.6 ng/mL
MSHCG MOM: 0.68
MSHCG: 18833 m[IU]/mL
Maternal Age At EDD: 21.7 yr
Osb Risk: 10000
Test Results:: NEGATIVE
Weight: 143 [lb_av]
uE3 Mom: 1.35
uE3 Value: 2.01 ng/mL

## 2018-04-16 LAB — OBSTETRIC PANEL, INCLUDING HIV
Antibody Screen: NEGATIVE
BASOS ABS: 0.1 10*3/uL (ref 0.0–0.2)
Basos: 1 %
EOS (ABSOLUTE): 0.1 10*3/uL (ref 0.0–0.4)
EOS: 1 %
HEMOGLOBIN: 12.7 g/dL (ref 11.1–15.9)
HEP B S AG: NEGATIVE
HIV Screen 4th Generation wRfx: NONREACTIVE
Hematocrit: 36.9 % (ref 34.0–46.6)
IMMATURE GRANULOCYTES: 0 %
Immature Grans (Abs): 0 10*3/uL (ref 0.0–0.1)
Lymphocytes Absolute: 2 10*3/uL (ref 0.7–3.1)
Lymphs: 24 %
MCH: 29.8 pg (ref 26.6–33.0)
MCHC: 34.4 g/dL (ref 31.5–35.7)
MCV: 87 fL (ref 79–97)
MONOCYTES: 8 %
Monocytes Absolute: 0.7 10*3/uL (ref 0.1–0.9)
NEUTROS ABS: 5.5 10*3/uL (ref 1.4–7.0)
NEUTROS PCT: 66 %
PLATELETS: 253 10*3/uL (ref 150–450)
RBC: 4.26 x10E6/uL (ref 3.77–5.28)
RDW: 13 % (ref 11.7–15.4)
RH TYPE: POSITIVE
RPR: NONREACTIVE
RUBELLA: 1.26 {index} (ref >0.99)
WBC: 8.3 10*3/uL (ref 3.4–10.8)

## 2018-04-16 LAB — URINALYSIS, ROUTINE W REFLEX MICROSCOPIC
Bilirubin, UA: NEGATIVE
GLUCOSE, UA: NEGATIVE
Ketones, UA: NEGATIVE
NITRITE UA: NEGATIVE
Protein, UA: NEGATIVE
RBC, UA: NEGATIVE
Urobilinogen, Ur: 0.2 mg/dL (ref 0.2–1.0)
pH, UA: 7.5 (ref 5.0–7.5)

## 2018-04-16 LAB — MICROSCOPIC EXAMINATION: Casts: NONE SEEN /lpf

## 2018-04-16 LAB — SICKLE CELL SCREEN: Sickle Cell Screen: NEGATIVE

## 2018-04-17 LAB — CYTOLOGY - PAP
Chlamydia: NEGATIVE
Diagnosis: UNDETERMINED — AB
HPV: DETECTED — AB
Neisseria Gonorrhea: NEGATIVE

## 2018-04-18 ENCOUNTER — Encounter: Payer: Self-pay | Admitting: Advanced Practice Midwife

## 2018-04-18 DIAGNOSIS — R87810 Cervical high risk human papillomavirus (HPV) DNA test positive: Secondary | ICD-10-CM

## 2018-04-18 DIAGNOSIS — Z8742 Personal history of other diseases of the female genital tract: Secondary | ICD-10-CM | POA: Insufficient documentation

## 2018-04-18 DIAGNOSIS — R87610 Atypical squamous cells of undetermined significance on cytologic smear of cervix (ASC-US): Secondary | ICD-10-CM | POA: Insufficient documentation

## 2018-05-08 ENCOUNTER — Telehealth: Payer: Self-pay | Admitting: *Deleted

## 2018-05-08 NOTE — Telephone Encounter (Signed)
Attempted to call patient to switch to a tele vist, no answer and vm not set up.

## 2018-05-09 ENCOUNTER — Ambulatory Visit (INDEPENDENT_AMBULATORY_CARE_PROVIDER_SITE_OTHER): Payer: Medicaid Other | Admitting: Obstetrics and Gynecology

## 2018-05-09 ENCOUNTER — Other Ambulatory Visit: Payer: Self-pay

## 2018-05-09 ENCOUNTER — Encounter: Payer: Self-pay | Admitting: Obstetrics and Gynecology

## 2018-05-09 VITALS — BP 114/73 | HR 95 | Temp 98.5°F | Wt 149.6 lb

## 2018-05-09 DIAGNOSIS — Z1389 Encounter for screening for other disorder: Secondary | ICD-10-CM

## 2018-05-09 DIAGNOSIS — Z3A22 22 weeks gestation of pregnancy: Secondary | ICD-10-CM

## 2018-05-09 DIAGNOSIS — Z3402 Encounter for supervision of normal first pregnancy, second trimester: Secondary | ICD-10-CM

## 2018-05-09 DIAGNOSIS — Z331 Pregnant state, incidental: Secondary | ICD-10-CM

## 2018-05-09 LAB — POCT URINALYSIS DIPSTICK OB
Blood, UA: NEGATIVE
Glucose, UA: NEGATIVE
Ketones, UA: NEGATIVE
Leukocytes, UA: NEGATIVE
Nitrite, UA: NEGATIVE
POC,PROTEIN,UA: NEGATIVE

## 2018-05-09 NOTE — Progress Notes (Signed)
Patient ID: Kimberly Kidd, female   DOB: [birth year], 23 y.o.   MRN: [id number]    LOW-RISK PREGNANCY VISIT Patient name: Kimberly Kidd MRN [id number]  Date of birth: 03/11/1995 Chief Complaint:   Routine Prenatal Visit  History of Present Illness:   Loni Kolin is a 23 y.o. G1P0 female at [redacted]w[redacted]d with an Estimated Date of Delivery: 09/09/18 being seen today for ongoing management of a low-risk pregnancy. Hx of cocaine use, clean 2 years, ASCUS w/ +HRHP 04/11/2018. FOB is excited as well and she lives with him. Hx of anxiety but says that has improved. Is not currently taking any medication nor seeking counseling. Says that baby is still when she is walking around but baby is active any other time. Has lower abdomen pressure nml, no abnormal vagina discharge or bleeding. Today she reports no complaints.  .  .   . denies leaking of fluid. Review of Systems:   Pertinent items are noted in HPI Denies abnormal vaginal discharge w/ itching/odor/irritation, headaches, visual changes, shortness of breath, chest pain, abdominal pain, severe nausea/vomiting, or problems with urination or bowel movements unless otherwise stated above. Pertinent History Reviewed:  Reviewed past medical,surgical, social, obstetrical and family history.  Reviewed problem list, medications and allergies. Physical Assessment:  There were no vitals filed for this visit.There is no height or weight on file to calculate BMI.        Physical Examination:   General appearance: Well appearing, and in no distress  Mental status: Alert, oriented to person, place, and time  Skin: Warm & dry  Cardiovascular: Normal heart rate noted  Respiratory: Normal respiratory effort, no distress  Abdomen: Soft, gravid, nontender  Pelvic: Cervical exam deferred         Extremities:    Fetal Status:          Results for orders placed or performed in visit on 05/09/18 (from the past 24 hour(s))  POC Urinalysis Dipstick OB   Collection Time: 05/09/18 11:05 AM  Result Value Ref Range   Color, UA     Clarity, UA     Glucose, UA Negative Negative   Bilirubin, UA     Ketones, UA neg    Spec Grav, UA     Blood, UA neg    pH, UA     POC,PROTEIN,UA Negative Negative, Trace, Small (1+), Moderate (2+), Large (3+), 4+   Urobilinogen, UA     Nitrite, UA neg    Leukocytes, UA Negative Negative   Appearance     Odor      Assessment & Plan:  1) Low-risk pregnancy G1P0 at 109w3d with an Estimated Date of Delivery: 09/09/18    2) Discussed kick counts, 10 kicks in 2 hours, childbirth classes info given to pt.  Meds: No orders of the defined types were placed in this encounter.  Labs/procedures today: None  Plan:   1. Continue routine obstetrical care  2. F/u 4 weeks PN2  Follow-up: No follow-ups on file.  Orders Placed This Encounter  Procedures  . POC Urinalysis Dipstick OB   By signing my name below, I, Arnette Norris, attest that this documentation has been prepared under the direction and in the presence of Tilda Burrow, MD Electronically Signed: Arnette Norris Medical Scribe. 05/09/18. 11:09 AM.  I personally performed the services described in this documentation, which was SCRIBED in my presence. The recorded information has been reviewed and considered accurate. It has been edited as necessary during review. Tilda Burrow,  MD

## 2018-06-06 ENCOUNTER — Encounter: Payer: Self-pay | Admitting: Obstetrics & Gynecology

## 2018-06-06 ENCOUNTER — Other Ambulatory Visit: Payer: Self-pay

## 2018-06-06 ENCOUNTER — Encounter: Payer: Self-pay | Admitting: *Deleted

## 2018-06-07 ENCOUNTER — Other Ambulatory Visit: Payer: Medicaid Other

## 2018-06-07 ENCOUNTER — Encounter: Payer: Self-pay | Admitting: Obstetrics & Gynecology

## 2018-06-07 ENCOUNTER — Other Ambulatory Visit: Payer: Self-pay

## 2018-06-07 ENCOUNTER — Ambulatory Visit (INDEPENDENT_AMBULATORY_CARE_PROVIDER_SITE_OTHER): Payer: Medicaid Other | Admitting: Obstetrics & Gynecology

## 2018-06-07 VITALS — BP 121/74 | HR 83 | Temp 98.4°F | Wt 151.4 lb

## 2018-06-07 DIAGNOSIS — Z3A26 26 weeks gestation of pregnancy: Secondary | ICD-10-CM

## 2018-06-07 DIAGNOSIS — Z3402 Encounter for supervision of normal first pregnancy, second trimester: Secondary | ICD-10-CM

## 2018-06-07 DIAGNOSIS — Z331 Pregnant state, incidental: Secondary | ICD-10-CM

## 2018-06-07 DIAGNOSIS — Z1389 Encounter for screening for other disorder: Secondary | ICD-10-CM

## 2018-06-07 LAB — POCT URINALYSIS DIPSTICK OB
Blood, UA: NEGATIVE
Glucose, UA: NEGATIVE
Ketones, UA: NEGATIVE
Leukocytes, UA: NEGATIVE
Nitrite, UA: NEGATIVE
POC,PROTEIN,UA: NEGATIVE

## 2018-06-07 NOTE — Progress Notes (Signed)
   LOW-RISK PREGNANCY VISIT Patient name: Kimberly Kidd MRN [id number]  Date of birth: [birth year] Chief Complaint:   Routine Prenatal Visit (PN2)  History of Present Illness:   Kimberly Kidd is a 23 y.o. G1P0 female at [redacted]w[redacted]d with an Estimated Date of Delivery: 09/09/18 being seen today for ongoing management of a low-risk pregnancy.  Today she reports no complaints. Contractions: Not present.  .  Movement: Present. denies leaking of fluid. Review of Systems:   Pertinent items are noted in HPI Denies abnormal vaginal discharge w/ itching/odor/irritation, headaches, visual changes, shortness of breath, chest pain, abdominal pain, severe nausea/vomiting, or problems with urination or bowel movements unless otherwise stated above. Pertinent History Reviewed:  Reviewed past medical,surgical, social, obstetrical and family history.  Reviewed problem list, medications and allergies. Physical Assessment:   Vitals:   06/07/18 0911  BP: 121/74  Pulse: 83  Temp: 98.4 F (36.9 C)  Weight: 151 lb 6.4 oz (68.7 kg)  Body mass index is 25.99 kg/m.        Physical Examination:   General appearance: Well appearing, and in no distress  Mental status: Alert, oriented to person, place, and time  Skin: Warm & dry  Cardiovascular: Normal heart rate noted  Respiratory: Normal respiratory effort, no distress  Abdomen: Soft, gravid, nontender  Pelvic: Cervical exam deferred         Extremities: Edema: None  Fetal Status:     Movement: Present    No results found for this or any previous visit (from the past 24 hour(s)).  Assessment & Plan:  1) Low-risk pregnancy G1P0 at [redacted]w[redacted]d with an Estimated Date of Delivery: 09/09/18      Meds: No orders of the defined types were placed in this encounter.  Labs/procedures today: PN2 today  Plan:  Continue routine obstetrical care   Reviewed: Preterm labor symptoms and general obstetric precautions including but not limited to vaginal bleeding,  contractions, leaking of fluid and fetal movement were reviewed in detail with the patient.  All questions were answered  Follow-up: Return in about 4 weeks (around 07/05/2018) for televisit, , LROB.  No orders of the defined types were placed in this encounter.  Lazaro Arms  06/07/2018 9:41 AM

## 2018-06-07 NOTE — Addendum Note (Signed)
Addended by: Federico Flake A on: 06/07/2018 09:50 AM   Modules accepted: Orders

## 2018-06-08 LAB — CBC
Hematocrit: 39.4 % (ref 34.0–46.6)
Hemoglobin: 13.3 g/dL (ref 11.1–15.9)
MCH: 29.8 pg (ref 26.6–33.0)
MCHC: 33.8 g/dL (ref 31.5–35.7)
MCV: 88 fL (ref 79–97)
Platelets: 307 10*3/uL (ref 150–450)
RBC: 4.47 x10E6/uL (ref 3.77–5.28)
RDW: 12.1 % (ref 11.7–15.4)
WBC: 8.2 10*3/uL (ref 3.4–10.8)

## 2018-06-08 LAB — ANTIBODY SCREEN: Antibody Screen: NEGATIVE

## 2018-06-08 LAB — GLUCOSE TOLERANCE, 2 HOURS W/ 1HR
Glucose, 1 hour: 96 mg/dL (ref 65–179)
Glucose, 2 hour: 86 mg/dL (ref 65–152)
Glucose, Fasting: 70 mg/dL (ref 65–91)

## 2018-06-08 LAB — RPR: RPR Ser Ql: NONREACTIVE

## 2018-06-08 LAB — HIV ANTIBODY (ROUTINE TESTING W REFLEX): HIV Screen 4th Generation wRfx: NONREACTIVE

## 2018-06-16 ENCOUNTER — Inpatient Hospital Stay (HOSPITAL_COMMUNITY)
Admission: AD | Admit: 2018-06-16 | Discharge: 2018-06-16 | Disposition: A | Discharge status: Home / Self Care | Payer: Medicaid Other | Attending: Obstetrics and Gynecology | Admitting: Obstetrics and Gynecology

## 2018-06-16 ENCOUNTER — Encounter (HOSPITAL_COMMUNITY): Payer: Self-pay

## 2018-06-16 ENCOUNTER — Other Ambulatory Visit: Payer: Self-pay

## 2018-06-16 DIAGNOSIS — Z87891 Personal history of nicotine dependence: Secondary | ICD-10-CM | POA: Diagnosis not present

## 2018-06-16 DIAGNOSIS — O26892 Other specified pregnancy related conditions, second trimester: Secondary | ICD-10-CM | POA: Diagnosis not present

## 2018-06-16 DIAGNOSIS — R1030 Lower abdominal pain, unspecified: Secondary | ICD-10-CM | POA: Diagnosis present

## 2018-06-16 DIAGNOSIS — R109 Unspecified abdominal pain: Secondary | ICD-10-CM | POA: Insufficient documentation

## 2018-06-16 DIAGNOSIS — Z3A27 27 weeks gestation of pregnancy: Secondary | ICD-10-CM | POA: Diagnosis not present

## 2018-06-16 DIAGNOSIS — O26899 Other specified pregnancy related conditions, unspecified trimester: Secondary | ICD-10-CM

## 2018-06-16 DIAGNOSIS — Z711 Person with feared health complaint in whom no diagnosis is made: Secondary | ICD-10-CM

## 2018-06-16 LAB — URINALYSIS, ROUTINE W REFLEX MICROSCOPIC
Bilirubin Urine: NEGATIVE
Glucose, UA: NEGATIVE mg/dL
Hgb urine dipstick: NEGATIVE
Ketones, ur: NEGATIVE mg/dL
Leukocytes,Ua: NEGATIVE
Nitrite: NEGATIVE
Protein, ur: NEGATIVE mg/dL
Specific Gravity, Urine: 1.003 — ABNORMAL LOW (ref 1.005–1.030)
pH: 6 (ref 5.0–8.0)

## 2018-06-16 NOTE — MAU Note (Signed)
Pt requesting to be discharged as her ride is here. MAU provider notified.

## 2018-06-16 NOTE — Discharge Instructions (Signed)
Abdominal Pain During Pregnancy ° °Abdominal pain is common during pregnancy, and has many possible causes. Some causes are more serious than others, and sometimes the cause is not known. Abdominal pain can be a sign that labor is starting. It can also be caused by normal growth and stretching of muscles and ligaments during pregnancy. Always tell your health care provider if you have any abdominal pain. °Follow these instructions at home: °· Do not have sex or put anything in your vagina until your pain goes away completely. °· Get plenty of rest until your pain improves. °· Drink enough fluid to keep your urine pale yellow. °· Take over-the-counter and prescription medicines only as told by your health care provider. °· Keep all follow-up visits as told by your health care provider. This is important. °Contact a health care provider if: °· Your pain continues or gets worse after resting. °· You have lower abdominal pain that: °? Comes and goes at regular intervals. °? Spreads to your back. °? Is similar to menstrual cramps. °· You have pain or burning when you urinate. °Get help right away if: °· You have a fever or chills. °· You have vaginal bleeding. °· You are leaking fluid from your vagina. °· You are passing tissue from your vagina. °· You have vomiting or diarrhea that lasts for more than 24 hours. °· Your baby is moving less than usual. °· You feel very weak or faint. °· You have shortness of breath. °· You develop severe pain in your upper abdomen. °Summary °· Abdominal pain is common during pregnancy, and has many possible causes. °· If you experience abdominal pain during pregnancy, tell your health care provider right away. °· Follow your health care provider's home care instructions and keep all follow-up visits as directed. °This information is not intended to replace advice given to you by your health care provider. Make sure you discuss any questions you have with your health care  provider. °Document Released: 01/16/2005 Document Revised: 04/20/2016 Document Reviewed: 04/20/2016 °Elsevier Interactive Patient Education © 2019 [employer] ° °

## 2018-06-16 NOTE — MAU Provider Note (Signed)
History     CSN: [id number]  Arrival date and time: 06/16/18 9233   First Provider Initiated Contact with Patient 06/16/18 0049      Chief Complaint  Patient presents with  . Abdominal Pain   HPI   [name] is a 23 y.o. G1P0 @ [redacted]w[redacted]d here in MAU with lower abdominal cramping that started yesterday. Her FOB is in the [facility] ED after an overdose and she doesn't think he is going to make it. Since all of this has been going on she has been feeling cramps in her abdomen. She rates her pain 4/10; the pain comes and goes. She feels like she is eating and drinking well. + fetal movement. No vaginal bleeding.   OB History    Gravida  1   Para      Term      Preterm      AB      Living        SAB      TAB      Ectopic      Multiple      Live Births              Past Medical History:  Diagnosis Date  . Anxiety   . Asthma   . Hx of eating disorder 11/06/2014  . Post traumatic stress disorder (PTSD) 11/06/2014  . Social anxiety disorder 11/06/2014  . Suicidal ideation 11/06/2014    Past Surgical History:  Procedure Laterality Date  . TONSILLECTOMY    . tubes in ears      Family History  Problem Relation Age of Onset  . Alcohol abuse Maternal Grandfather   . Drug abuse Mother   . Bipolar disorder Mother   . Other Father        murdered  . Bipolar disorder Maternal Aunt   . Schizophrenia Maternal Uncle   . Cancer Paternal Grandfather   . Other Maternal Grandmother        open heart surgery  . Diabetes Maternal Grandmother     Social History   Tobacco Use  . Smoking status: Former Smoker    Packs/day: 0.05    Types: Cigarettes  . Smokeless tobacco: Former Neurosurgeon    Types: Snuff  Substance Use Topics  . Alcohol use: Not Currently  . Drug use: Not Currently    Types: Cocaine    Comment: "clean 2 years"    Allergies: No Known Allergies  Medications Prior to Admission  Medication Sig Dispense Refill Last Dose  . prenatal vitamin w/FE, FA  (PRENATAL 1 + 1) 27-1 MG TABS tablet Take 1 tablet by mouth daily at 12 noon. 30 each 12 06/15/2018 at Unknown time   Results for orders placed or performed during the hospital encounter of 06/16/18 (from the past 48 hour(s))  Urinalysis, Routine w reflex microscopic     Status: Abnormal   Collection Time: 06/16/18  1:08 AM  Result Value Ref Range   Color, Urine STRAW (A) YELLOW   APPearance CLEAR CLEAR   Specific Gravity, Urine 1.003 (L) 1.005 - 1.030   pH 6.0 5.0 - 8.0   Glucose, UA NEGATIVE NEGATIVE mg/dL   Hgb urine dipstick NEGATIVE NEGATIVE   Bilirubin Urine NEGATIVE NEGATIVE   Ketones, ur NEGATIVE NEGATIVE mg/dL   Protein, ur NEGATIVE NEGATIVE mg/dL   Nitrite NEGATIVE NEGATIVE   Leukocytes,Ua NEGATIVE NEGATIVE    Comment: Performed at [facility], [address] [place], [place] [address]  Review of Systems  Gastrointestinal: Positive for abdominal pain. Negative for nausea and vomiting.  Genitourinary: Negative for vaginal bleeding.   Physical Exam   Blood pressure 125/79, pulse 97, temperature 98.8 F (37.1 C), temperature source Oral, resp. rate 16, height 5\' 3"  (1.6 m), weight 70.5 kg, last menstrual period 12/03/2017, SpO2 98 %.  Physical Exam  Constitutional: She is oriented to person, place, and time. She appears well-developed and well-nourished. No distress.  HENT:  Head: Normocephalic.  Eyes: Pupils are equal, round, and reactive to light.  GI: Soft. She exhibits no distension. There is no abdominal tenderness. There is no rebound.  Genitourinary:    Genitourinary Comments: Cervix: closed, thick, posterior    Musculoskeletal: Normal range of motion.  Neurological: She is alert and oriented to person, place, and time.  Skin: Skin is warm. She is not diaphoretic.  Psychiatric: Her behavior is normal.   Fetal Tracing: Baseline: 130 bpm Variability: Moderate  Accelerations: 15x15 Decelerations: None Toco: None  MAU Course  Procedures   None  MDM  Patient fully dressed, monitor removed by patient. Says she is ready to go and her ride is here.   Assessment and Plan   A:  1. Physically well but worried   2. Abdominal cramping affecting pregnancy   3. [redacted] weeks gestation of pregnancy     P:  Discharge home in stable condition Return to MAU if symptoms worsen Follow up with OB  Rasch, Harolyn RutherfordJennifer I, NP 06/16/2018 2:57 AM

## 2018-06-16 NOTE — MAU Note (Signed)
Pt reports intermittent lower abdominal pain that started about 1 day ago. Pt reports the pain is crampy. States that she has not tried anything but reports that lying down makes the pain somewhat better. Pt denies vaginal discharge, LOF or vaginal bleeding. Reports good fetal movement. Denies recent intercourse or pelvic exams. Pt denies urinary s/s.

## 2018-07-03 ENCOUNTER — Encounter: Payer: Self-pay | Admitting: *Deleted

## 2018-07-04 ENCOUNTER — Other Ambulatory Visit: Payer: Self-pay

## 2018-07-04 ENCOUNTER — Encounter: Payer: Self-pay | Admitting: Family Medicine

## 2018-07-04 ENCOUNTER — Ambulatory Visit (INDEPENDENT_AMBULATORY_CARE_PROVIDER_SITE_OTHER): Payer: Medicaid Other | Admitting: Family Medicine

## 2018-07-04 VITALS — BP 127/69 | HR 84

## 2018-07-04 DIAGNOSIS — F4010 Social phobia, unspecified: Secondary | ICD-10-CM

## 2018-07-04 DIAGNOSIS — Z3A30 30 weeks gestation of pregnancy: Secondary | ICD-10-CM

## 2018-07-04 DIAGNOSIS — O99343 Other mental disorders complicating pregnancy, third trimester: Secondary | ICD-10-CM

## 2018-07-04 DIAGNOSIS — O0932 Supervision of pregnancy with insufficient antenatal care, second trimester: Secondary | ICD-10-CM

## 2018-07-04 DIAGNOSIS — F332 Major depressive disorder, recurrent severe without psychotic features: Secondary | ICD-10-CM

## 2018-07-04 DIAGNOSIS — O99323 Drug use complicating pregnancy, third trimester: Secondary | ICD-10-CM

## 2018-07-04 DIAGNOSIS — F1411 Cocaine abuse, in remission: Secondary | ICD-10-CM | POA: Diagnosis not present

## 2018-07-04 DIAGNOSIS — Z3403 Encounter for supervision of normal first pregnancy, third trimester: Secondary | ICD-10-CM

## 2018-07-04 NOTE — Patient Instructions (Signed)
Braxton Hicks Contractions Contractions of the uterus can occur throughout pregnancy, but they are not always a sign that you are in labor. You may have practice contractions called Braxton Hicks contractions. These false labor contractions are sometimes confused with true labor. What are Braxton Hicks contractions? Braxton Hicks contractions are tightening movements that occur in the muscles of the uterus before labor. Unlike true labor contractions, these contractions do not result in opening (dilation) and thinning of the cervix. Toward the end of pregnancy (32-34 weeks), Braxton Hicks contractions can happen more often and may become stronger. These contractions are sometimes difficult to tell apart from true labor because they can be very uncomfortable. You should not feel embarrassed if you go to the hospital with false labor. Sometimes, the only way to tell if you are in true labor is for your health care provider to look for changes in the cervix. The health care provider will do a physical exam and may monitor your contractions. If you are not in true labor, the exam should show that your cervix is not dilating and your water has not broken. If there are no other health problems associated with your pregnancy, it is completely safe for you to be sent home with false labor. You may continue to have Braxton Hicks contractions until you go into true labor. How to tell the difference between true labor and false labor True labor  Contractions last 30-70 seconds.  Contractions become very regular.  Discomfort is usually felt in the top of the uterus, and it spreads to the lower abdomen and low back.  Contractions do not go away with walking.  Contractions usually become more intense and increase in frequency.  The cervix dilates and gets thinner. False labor  Contractions are usually shorter and not as strong as true labor contractions.  Contractions are usually irregular.  Contractions  are often felt in the front of the lower abdomen and in the groin.  Contractions may go away when you walk around or change positions while lying down.  Contractions get weaker and are shorter-lasting as time goes on.  The cervix usually does not dilate or become thin. Follow these instructions at home:   Take over-the-counter and prescription medicines only as told by your health care provider.  Keep up with your usual exercises and follow other instructions from your health care provider.  Eat and drink lightly if you think you are going into labor.  If Braxton Hicks contractions are making you uncomfortable: ? Change your position from lying down or resting to walking, or change from walking to resting. ? Sit and rest in a tub of warm water. ? Drink enough fluid to keep your urine pale yellow. Dehydration may cause these contractions. ? Do slow and deep breathing several times an hour.  Keep all follow-up prenatal visits as told by your health care provider. This is important. Contact a health care provider if:  You have a fever.  You have continuous pain in your abdomen. Get help right away if:  Your contractions become stronger, more regular, and closer together.  You have fluid leaking or gushing from your vagina.  You pass blood-tinged mucus (bloody show).  You have bleeding from your vagina.  You have low back pain that you never had before.  You feel your baby's head pushing down and causing pelvic pressure.  Your baby is not moving inside you as much as it used to. Summary  Contractions that occur before labor are   called Braxton Hicks contractions, false labor, or practice contractions.  Braxton Hicks contractions are usually shorter, weaker, farther apart, and less regular than true labor contractions. True labor contractions usually become progressively stronger and regular, and they become more frequent.  Manage discomfort from Braxton Hicks contractions  by changing position, resting in a warm bath, drinking plenty of water, or practicing deep breathing. This information is not intended to replace advice given to you by your health care provider. Make sure you discuss any questions you have with your health care provider. Document Released: 06/01/2016 Document Revised: 10/31/2016 Document Reviewed: 06/01/2016 Elsevier Interactive Patient Education  2019 Elsevier Inc.  

## 2018-07-04 NOTE — Progress Notes (Signed)
    TELEHEALTH VIRTUAL OBSTETRICS VISIT ENCOUNTER NOTE  I connected with Kimberly Kidd on 07/07/18 at 11:30 AM EDT by telephone and verified that I am speaking with the correct person using two identifiers.   I discussed the limitations, risks, security and privacy concerns of performing an evaluation and management service by telephone and the availability of in person appointments. I also discussed with the patient that there may be a patient responsible charge related to this service. The patient expressed understanding and agreed to proceed.  Subjective:  Kimberly Kidd is a 23 y.o. G1P0 at [redacted]w[redacted]d being followed for ongoing prenatal care.  She is currently monitored for the following issues for this high-risk pregnancy and has Generalized anxiety disorder; MDD (major depressive disorder), recurrent episode, severe (HCC); Social anxiety disorder; Post traumatic stress disorder (PTSD); Hx of eating disorder; Suicidal ideation; Supervision of normal first pregnancy; Late prenatal care in second trimester; History of cocaine abuse (HCC); and ASCUS with positive high risk HPV cervical on their problem list.  Patient reports contractions since last week. Last for 20 seconds, and intermittent every 10-15 min.  Reports fetal movement. Denies any contractions, bleeding or leaking of fluid.   The following portions of the patient's history were reviewed and updated as appropriate: allergies, current medications, past family history, past medical history, past social history, past surgical history and problem list.   Objective:   General:  Alert, oriented and cooperative.   Mental Status: Normal mood and affect perceived. Normal judgment and thought content.  Rest of physical exam deferred due to type of encounter  Assessment and Plan:  Pregnancy: G1P0 at [redacted]w[redacted]d  1. Encounter for supervision of normal first pregnancy in third trimester Up to date Discussed contractions and labor onset at  length. Patient current ctx appear more braxton hicks in nature  2. Late prenatal care in second trimester  3. History of cocaine abuse (HCC)  4. Severe episode of recurrent major depressive disorder, without psychotic features (HCC) -interactive on the phone - Consider in patient SW assessment and BH visit after delivery given higher risk of PPD.  - Patient with multiple stressors in life including FOB recently dying- patient said "he just got tired of it" and I didn't clarify if this was a suicide. She reports support with a counselor and I encouraged her to reach out for extra visits given recent death of FOB and very high risk for grief rxn.   5. Social anxiety disorder     Preterm labor symptoms and general obstetric precautions including but not limited to vaginal bleeding, contractions, leaking of fluid and fetal movement were reviewed in detail with the patient.  I discussed the assessment and treatment plan with the patient. The patient was provided an opportunity to ask questions and all were answered. The patient agreed with the plan and demonstrated an understanding of the instructions. The patient was advised to call back or seek an in-person office evaluation/go to MAU at [facility] for any urgent or concerning symptoms. Please refer to After Visit Summary for other counseling recommendations.   I provided 11 minutes of non-face-to-face time during this encounter.  Return in about 2 weeks (around 07/18/2018) for Routine prenatal care , Telehealth/Virtual health OB Visit.  Future Appointments  Date Time Provider Department Center  07/18/2018 10:15 AM Tilda Burrow, MD [facility]    Federico Flake, MD Center for [facility], [facility]

## 2018-07-05 ENCOUNTER — Encounter: Payer: Medicaid Other | Admitting: Obstetrics & Gynecology

## 2018-07-09 ENCOUNTER — Telehealth: Payer: Self-pay | Admitting: Obstetrics and Gynecology

## 2018-07-09 NOTE — Telephone Encounter (Signed)
Pt requesting medication for heart burn. 

## 2018-07-09 NOTE — Telephone Encounter (Signed)
Pt called requesting something for heartburn. She hasn't tried anything OTC. I advised can try Tums, Maalox, Mylanta, Zantac or Pepcid. If no relief, call us back. Pt voiced understanding. [place]

## 2018-07-16 ENCOUNTER — Encounter: Payer: Self-pay | Admitting: *Deleted

## 2018-07-17 ENCOUNTER — Other Ambulatory Visit: Payer: Self-pay

## 2018-07-17 ENCOUNTER — Ambulatory Visit (INDEPENDENT_AMBULATORY_CARE_PROVIDER_SITE_OTHER): Payer: Medicaid Other | Admitting: Obstetrics and Gynecology

## 2018-07-17 VITALS — BP 120/80 | HR 82

## 2018-07-17 DIAGNOSIS — Z3A32 32 weeks gestation of pregnancy: Secondary | ICD-10-CM

## 2018-07-17 DIAGNOSIS — Z3403 Encounter for supervision of normal first pregnancy, third trimester: Secondary | ICD-10-CM

## 2018-07-17 NOTE — Progress Notes (Signed)
Patient ID: Kimberly Kidd, female   DOB: 08-12-1995, 23 y.o.   MRN: [id number]    TELEHEALTH VIRTUAL OBSTETRICS VISIT ENCOUNTER NOTE  I connected with Kimberly Kidd on @TODAY @ at  4:00 PM EDT by telephone at home and verified that I am speaking with the correct person using two identifiers.   I discussed the limitations, risks, security and privacy concerns of performing an evaluation and management service by telephone and the availability of in person appointments. I also discussed with the patient that there may be a patient responsible charge related to this service. The patient expressed understanding and agreed to proceed.  Subjective:  Kimberly Kidd is a 23 y.o. G1P0 at [redacted]w[redacted]d being followed for ongoing prenatal care.  She is currently monitored for the following issues for this low-risk pregnancy and has Generalized anxiety disorder; MDD (major depressive disorder), recurrent episode, severe ([place]); Social anxiety disorder; Post traumatic stress disorder (PTSD); Hx of eating disorder; Suicidal ideation; Supervision of normal first pregnancy; Late prenatal care in second trimester; History of cocaine abuse ([place]); and ASCUS with positive high risk HPV cervical on their problem list. The PTSD, 1 sources is that her baby's father died 06-29-22 from accidental death, details not explained by patient Patient reports no complaints and Currently the baby is quite active but she has occasional episodes where the baby does not move her couple days and then resumes normal activity.  This may be related to positional changes the infant but I have asked the patient to notify us if this happens again and we will can do NST. Reports fetal movement. Denies any contractions, bleeding or leaking of fluid.  She does have shooting pains into the vaginal area which I think represents lightening of pregnancy The following portions of the patient's history were reviewed and updated as appropriate: allergies,  current medications, past family history, past medical history, past social history, past surgical history and problem list.   Objective:   General:  Alert, oriented and cooperative.   Mental Status: Normal mood and affect perceived. Normal judgment and thought content.  Rest of physical exam deferred due to type of encounter  Assessment and Plan:  Pregnancy: G1P0 at [redacted]w[redacted]d Preterm labor symptoms and general obstetric precautions including but not limited to vaginal bleeding, contractions, leaking of fluid and fetal movement were reviewed in detail with the patient.  I discussed the assessment and treatment plan with the patient. The patient was provided an opportunity to ask questions and all were answered. The patient agreed with the plan and demonstrated an understanding of the instructions. The patient was advised to call back or seek an in-person office evaluation/go to MAU at [facility] for any urgent or concerning symptoms. Please refer to After Visit Summary for other counseling recommendations.   I provided 9 minutes of non-face-to-face time during this encounter. +5 minutes of documentation No follow-ups on file.  Future Appointments  Date Time Provider [place]  07/17/2018  4:00 PM Jonnie Kind, MD [facility]    By signing my name below, I, Kimberly Kidd, attest that this documentation has been prepared under the direction and in the presence of Jonnie Kind, MD. Electronically Signed: [place]. 07/17/18. 3:55 PM.  I personally performed the services described in this documentation, which was SCRIBED in my presence. The recorded information has been reviewed and considered accurate. It has been edited as necessary during review. Jonnie Kind, MD

## 2018-07-18 ENCOUNTER — Encounter: Payer: Medicaid Other | Admitting: Obstetrics and Gynecology

## 2018-07-30 ENCOUNTER — Other Ambulatory Visit: Payer: Self-pay

## 2018-07-30 ENCOUNTER — Encounter (HOSPITAL_COMMUNITY): Payer: Self-pay | Admitting: *Deleted

## 2018-07-30 ENCOUNTER — Emergency Department (HOSPITAL_COMMUNITY)
Admission: EM | Admit: 2018-07-30 | Discharge: 2018-07-30 | Disposition: A | Discharge status: Home / Self Care | Payer: Medicaid Other | Attending: Emergency Medicine | Admitting: Emergency Medicine

## 2018-07-30 DIAGNOSIS — J45909 Unspecified asthma, uncomplicated: Secondary | ICD-10-CM | POA: Diagnosis not present

## 2018-07-30 DIAGNOSIS — O9989 Other specified diseases and conditions complicating pregnancy, childbirth and the puerperium: Secondary | ICD-10-CM | POA: Diagnosis present

## 2018-07-30 DIAGNOSIS — Z87891 Personal history of nicotine dependence: Secondary | ICD-10-CM | POA: Diagnosis not present

## 2018-07-30 DIAGNOSIS — Z3A34 34 weeks gestation of pregnancy: Secondary | ICD-10-CM | POA: Diagnosis not present

## 2018-07-30 DIAGNOSIS — Z79899 Other long term (current) drug therapy: Secondary | ICD-10-CM | POA: Diagnosis not present

## 2018-07-30 DIAGNOSIS — Z3493 Encounter for supervision of normal pregnancy, unspecified, third trimester: Secondary | ICD-10-CM

## 2018-07-30 DIAGNOSIS — O479 False labor, unspecified: Secondary | ICD-10-CM | POA: Diagnosis not present

## 2018-07-30 DIAGNOSIS — R1030 Lower abdominal pain, unspecified: Secondary | ICD-10-CM | POA: Diagnosis not present

## 2018-07-30 LAB — URINALYSIS, ROUTINE W REFLEX MICROSCOPIC
Bilirubin Urine: NEGATIVE
Glucose, UA: NEGATIVE mg/dL
Hgb urine dipstick: NEGATIVE
Ketones, ur: NEGATIVE mg/dL
Leukocytes,Ua: NEGATIVE
Nitrite: NEGATIVE
Protein, ur: NEGATIVE mg/dL
Specific Gravity, Urine: 1.011 (ref 1.005–1.030)
pH: 6 (ref 5.0–8.0)

## 2018-07-30 NOTE — ED Notes (Signed)
Pt given cup of water 

## 2018-07-30 NOTE — ED Notes (Signed)
Spoke with Juliann Pulse, RN at [facility] who reported baby's HR is reassuring at 200 bpm. Dr Roxanne Mins given number to attending physician at [facility] hospital. Juliann Pulse, RN will continue to monitor.

## 2018-07-30 NOTE — Discharge Instructions (Addendum)
Your exam and fetal monitoring did not show any signs of any problem with the pregnancy.  These contractions are a normal part of the last stage of pregnancy.  Please call your obstetrician today to see if he wants to get you in sooner than your scheduled appointment a few days from now.  Return to the emergency department if you are passing any blood or have a gush of fluid or pain seems to be getting worse.  If possible, it would be best if return emergency visit could be at [facility] maternal admissions unit.  However, you are welcome to come back here if you do not feel you can safely get to [facility].

## 2018-07-30 NOTE — ED Triage Notes (Signed)
Pt c/o abdominal pain x 2 days; pt states she has been passing her mucous plug; pt states she is having irregular contractions for the last few hours; pt denies any gush or leaking of clear fluid

## 2018-07-30 NOTE — Progress Notes (Signed)
Pt is a G1P0, 34wk, 1da at [facility] ED, c/o abdominal pain. Fetal heart tones 155bpm when remote monitoring started. SVE by Dr. Roxanne Mins cervix high and closed.  Provided Dr Arther Abbott phone number.

## 2018-07-30 NOTE — ED Provider Notes (Signed)
[facility] EMERGENCY DEPARTMENT Provider Note   CSN: [id number] Arrival date & time: 07/30/18  0415   History   Chief Complaint Chief Complaint  Patient presents with  . Abdominal Pain    HPI Kimberly Kidd is a 23 y.o. female.   The history is provided by the patient.  Abdominal Pain She has history of depression and posttraumatic stress disorder and is currently pregnant [redacted] weeks 1 day and comes in because of uterine cramping and a rash over her lower abdomen.  The rash was noted earlier in the day and was noted to be red but not itchy.  It resolved before coming here.  Episodes of cramping will last about 1-2 hours and involve about 2 minutes of pain about every 10 minutes.  These episodes seem to be coming more frequently.  She also relates that she has passed some mucus over the last 3 days.  There has been no bloody show no watery discharge.  Pregnancy has been uncomplicated to this point.  She is gravida 1 para 0.  Past Medical History:  Diagnosis Date  . Anxiety   . Asthma   . Hx of eating disorder 11/06/2014  . Post traumatic stress disorder (PTSD) 11/06/2014  . Social anxiety disorder 11/06/2014  . Suicidal ideation 11/06/2014    Patient Active Problem List   Diagnosis Date Noted  . ASCUS with positive high risk HPV cervical 04/18/2018  . Supervision of normal first pregnancy 04/11/2018  . Late prenatal care in second trimester 04/11/2018  . History of cocaine abuse ([place]) 04/11/2018  . MDD (major depressive disorder), recurrent episode, severe ([place]) 11/06/2014  . Social anxiety disorder 11/06/2014  . Post traumatic stress disorder (PTSD) 11/06/2014  . Hx of eating disorder 11/06/2014  . Suicidal ideation 11/06/2014  . Generalized anxiety disorder 12/23/2010    Past Surgical History:  Procedure Laterality Date  . TONSILLECTOMY    . tubes in ears       OB History    Gravida  1   Para      Term      Preterm      AB      Living        SAB      TAB      Ectopic      Multiple      Live Births               Home Medications    Prior to Admission medications   Medication Sig Start Date End Date Taking? Authorizing Provider  prenatal vitamin w/FE, FA (PRENATAL 1 + 1) 27-1 MG TABS tablet Take 1 tablet by mouth daily at 12 noon. 03/19/18   Estill Dooms, NP    Family History Family History  Problem Relation Age of Onset  . Alcohol abuse Maternal Grandfather   . Drug abuse Mother   . Bipolar disorder Mother   . Other Father        murdered  . Bipolar disorder Maternal Aunt   . Schizophrenia Maternal Uncle   . Cancer Paternal Grandfather   . Other Maternal Grandmother        open heart surgery  . Diabetes Maternal Grandmother     Social History Social History   Tobacco Use  . Smoking status: Former Smoker    Packs/day: 0.05    Types: Cigarettes  . Smokeless tobacco: Former Systems developer    Types: Snuff  Substance Use Topics  . Alcohol use: Not  Currently  . Drug use: Not Currently    Types: Cocaine    Comment: "clean 2 years"     Allergies   Patient has no known allergies.   Review of Systems Review of Systems  Gastrointestinal: Positive for abdominal pain.  All other systems reviewed and are negative.    Physical Exam Updated Vital Signs BP 120/84 (BP Location: Left Arm)   Pulse 96   Temp 98.7 F (37.1 C) (Oral)   Resp 18   Ht 5\' 3"  (1.6 m)   Wt 74.8 kg   LMP 12/03/2017 (Approximate)   SpO2 99%   BMI 29.23 kg/m   Physical Exam Vitals signs and nursing note reviewed.    23 year old female, resting comfortably and in no acute distress. Vital signs are normal. Oxygen saturation is 99%, which is normal. Head is normocephalic and atraumatic. PERRLA, EOMI. Oropharynx is clear. Neck is nontender and supple without adenopathy or JVD. Back is nontender and there is no CVA tenderness. Lungs are clear without rales, wheezes, or rhonchi. Chest is nontender. Heart has regular rate and rhythm  without murmur. Abdomen has a gravid uterus with size consistent with dates.  There are no other masses or hepatosplenomegaly and peristalsis is normoactive. Pelvic: Normal external female genitalia.  Unable to feel the cervix as it is very far posterior, but baby's head is not engaged in the pelvis. Extremities have no cyanosis or edema, full range of motion is present. Skin is warm and dry without rash. Neurologic: Mental status is normal, cranial nerves are intact, there are no motor or sensory deficits.  ED Treatments / Results  Labs (all labs ordered are listed, but only abnormal results are displayed) Labs Reviewed  URINALYSIS, ROUTINE W REFLEX MICROSCOPIC    Procedures Procedures   Medications Ordered in ED Medications - No data to display   Initial Impression / Assessment and Plan / ED Course  I have reviewed the triage vital signs and the nursing notes.  Pertinent lab results that were available during my care of the patient were reviewed by me and considered in my medical decision making (see chart for details).  Third trimester pregnancy with pelvic cramping which appears to be CS[employer] CorporationBraxton Hicks contractions.  Transient rash of uncertain cause, but no rash present currently.  Old records reviewed confirming routine prenatal care with no complications.  She is placed on a fetal monitor.  Case is discussed with Dr. Terrill MohrAnywau who agrees with fetal monitoring and checking urinalysis, currently no indication for transfer to MAU.Marland Kitchen.  Urinalysis is normal.  Fetal monitoring continues to be normal.  She is felt to be safe for discharge.  She is to follow-up with her obstetrician.  Return precautions discussed.  Final Clinical Impressions(s) / ED Diagnoses   Final diagnoses:  Braxton Hicks contractions  Third trimester pregnancy    ED Discharge Orders    None       Dione BoozeGlick, Tailynn Armetta, MD 07/30/18 [phone]

## 2018-07-30 NOTE — ED Notes (Signed)
Pt ambulating to the bathroom

## 2018-07-31 ENCOUNTER — Encounter: Payer: Self-pay | Admitting: Advanced Practice Midwife

## 2018-07-31 ENCOUNTER — Ambulatory Visit (INDEPENDENT_AMBULATORY_CARE_PROVIDER_SITE_OTHER): Payer: Medicaid Other | Admitting: Advanced Practice Midwife

## 2018-07-31 VITALS — BP 118/76 | HR 103 | Wt 177.4 lb

## 2018-07-31 DIAGNOSIS — Z1389 Encounter for screening for other disorder: Secondary | ICD-10-CM

## 2018-07-31 DIAGNOSIS — Z3403 Encounter for supervision of normal first pregnancy, third trimester: Secondary | ICD-10-CM

## 2018-07-31 DIAGNOSIS — F4010 Social phobia, unspecified: Secondary | ICD-10-CM

## 2018-07-31 DIAGNOSIS — Z331 Pregnant state, incidental: Secondary | ICD-10-CM

## 2018-07-31 DIAGNOSIS — F4310 Post-traumatic stress disorder, unspecified: Secondary | ICD-10-CM

## 2018-07-31 DIAGNOSIS — F1411 Cocaine abuse, in remission: Secondary | ICD-10-CM

## 2018-07-31 DIAGNOSIS — O99343 Other mental disorders complicating pregnancy, third trimester: Secondary | ICD-10-CM

## 2018-07-31 DIAGNOSIS — F411 Generalized anxiety disorder: Secondary | ICD-10-CM

## 2018-07-31 DIAGNOSIS — Z3A34 34 weeks gestation of pregnancy: Secondary | ICD-10-CM

## 2018-07-31 LAB — POCT URINALYSIS DIPSTICK OB
Blood, UA: NEGATIVE
Glucose, UA: NEGATIVE
Ketones, UA: NEGATIVE
Leukocytes, UA: NEGATIVE
Nitrite, UA: NEGATIVE
POC,PROTEIN,UA: NEGATIVE

## 2018-07-31 MED ORDER — SERTRALINE HCL 50 MG PO TABS: 50.0000 mg | ORAL_TABLET | Freq: Every day | ORAL | 6 refills | Status: DC

## 2018-07-31 NOTE — Progress Notes (Signed)
  G1P0 [redacted]w[redacted]d Estimated Date of Delivery: 09/09/18  Blood pressure 118/76, pulse (!) 103, weight 177 lb 6.4 oz (80.5 kg), last menstrual period 12/03/2017.   BP weight and urine results all reviewed and noted.  Please refer to the obstetrical flow sheet for the fundal height and fetal heart rate documentation:  Patient reports good fetal movement, denies any bleeding and no rupture of membranes symptoms or regular contractions. Patient is having some stress/depression (long hx) but esp now since FOB died of accidental drug overdose last month. Had taken zoloft in the past w/good results . Risks/benefits of meds during pregnancy discussed, will start zoloft.   All questions were answered.   Physical Assessment:   Vitals:   07/31/18 1353  BP: 118/76  Pulse: (!) 103  Weight: 177 lb 6.4 oz (80.5 kg)  Body mass index is 31.42 kg/m.        Physical Examination:   General appearance: Well appearing, and in no distress  Mental status: Alert, oriented to person, place, and time  Skin: Warm & dry  Cardiovascular: Normal heart rate noted  Respiratory: Normal respiratory effort, no distress  Abdomen: Soft, gravid, nontender  Pelvic: Cervical exam deferred         Extremities: Edema: None  Fetal Status:     Movement: Present    Results for orders placed or performed in visit on 07/31/18 (from the past 24 hour(s))  POC Urinalysis Dipstick OB   Collection Time: 07/31/18  1:58 PM  Result Value Ref Range   Color, UA     Clarity, UA     Glucose, UA Negative Negative   Bilirubin, UA     Ketones, UA neg    Spec Grav, UA     Blood, UA neg    pH, UA     POC,PROTEIN,UA Negative Negative, Trace, Small (1+), Moderate (2+), Large (3+), 4+   Urobilinogen, UA     Nitrite, UA neg    Leukocytes, UA Negative Negative   Appearance     Odor       Orders Placed This Encounter  Procedures  . Pain Management Screening Profile (10S)  . POC Urinalysis Dipstick OB    Plan:  Continued routine  obstetrical care,   Start zoloft 25 mg for a few days, increase to 50mg .   Return in about 2 weeks (around 08/14/2018) for LROB in person.

## 2018-07-31 NOTE — Patient Instructions (Signed)

## 2018-08-02 LAB — PMP SCREEN PROFILE (10S), URINE
Amphetamine Scrn, Ur: NEGATIVE ng/mL
BARBITURATE SCREEN URINE: NEGATIVE ng/mL
BENZODIAZEPINE SCREEN, URINE: NEGATIVE ng/mL
CANNABINOIDS UR QL SCN: NEGATIVE ng/mL
Cocaine (Metab) Scrn, Ur: NEGATIVE ng/mL
Creatinine(Crt), U: 84.4 mg/dL (ref 20.0–300.0)
Methadone Screen, Urine: NEGATIVE ng/mL
OXYCODONE+OXYMORPHONE UR QL SCN: NEGATIVE ng/mL
Opiate Scrn, Ur: NEGATIVE ng/mL
Ph of Urine: 5.8 (ref 4.5–8.9)
Phencyclidine Qn, Ur: NEGATIVE ng/mL
Propoxyphene Scrn, Ur: NEGATIVE ng/mL

## 2018-08-14 ENCOUNTER — Encounter: Payer: Self-pay | Admitting: Advanced Practice Midwife

## 2018-08-14 ENCOUNTER — Other Ambulatory Visit: Payer: Self-pay

## 2018-08-14 ENCOUNTER — Ambulatory Visit (INDEPENDENT_AMBULATORY_CARE_PROVIDER_SITE_OTHER): Payer: Medicaid Other | Admitting: Advanced Practice Midwife

## 2018-08-14 VITALS — BP 103/66 | HR 91 | Wt 179.0 lb

## 2018-08-14 DIAGNOSIS — Z3403 Encounter for supervision of normal first pregnancy, third trimester: Secondary | ICD-10-CM

## 2018-08-14 DIAGNOSIS — Z3483 Encounter for supervision of other normal pregnancy, third trimester: Secondary | ICD-10-CM

## 2018-08-14 DIAGNOSIS — Z1389 Encounter for screening for other disorder: Secondary | ICD-10-CM

## 2018-08-14 DIAGNOSIS — Z3A36 36 weeks gestation of pregnancy: Secondary | ICD-10-CM

## 2018-08-14 DIAGNOSIS — Z331 Pregnant state, incidental: Secondary | ICD-10-CM

## 2018-08-14 LAB — POCT URINALYSIS DIPSTICK OB
Blood, UA: NEGATIVE
Glucose, UA: NEGATIVE
Ketones, UA: NEGATIVE
Leukocytes, UA: NEGATIVE
Nitrite, UA: NEGATIVE
POC,PROTEIN,UA: NEGATIVE

## 2018-08-14 NOTE — Patient Instructions (Signed)

## 2018-08-14 NOTE — Progress Notes (Signed)
  G1P0 [redacted]w[redacted]d Estimated Date of Delivery: 09/09/18  Blood pressure 103/66, pulse 91, weight 179 lb (81.2 kg), last menstrual period 12/03/2017.   BP weight and urine results all reviewed and noted.  Please refer to the obstetrical flow sheet for the fundal height and fetal heart rate documentation:  Patient reports good fetal movement, denies any bleeding and no rupture of membranes symptoms or regular contractions. Started zoloft 2 weeks ago.  Feels like it's helping a little so far.   Patient is without complaints. All questions were answered.   Physical Assessment:   Vitals:   08/14/18 1354  BP: 103/66  Pulse: 91  Weight: 179 lb (81.2 kg)  Body mass index is 31.71 kg/m.        Physical Examination:   General appearance: Well appearing, and in no distress  Mental status: Alert, oriented to person, place, and time  Skin: Warm & dry  Cardiovascular: Normal heart rate noted  Respiratory: Normal respiratory effort, no distress  Abdomen: Soft, gravid, nontender  Pelvic: Cervical exam performed  Dilation: Closed Effacement (%): 50 Station: -1  Extremities: Edema: None  Fetal Status: Fetal Heart Rate (bpm): 142 Fundal Height: 35 cm Movement: Present Presentation: Vertex  Results for orders placed or performed in visit on 08/14/18 (from the past 24 hour(s))  POC Urinalysis Dipstick OB   Collection Time: 08/14/18  2:13 PM  Result Value Ref Range   Color, UA     Clarity, UA     Glucose, UA Negative Negative   Bilirubin, UA     Ketones, UA neg    Spec Grav, UA     Blood, UA neg    pH, UA     POC,PROTEIN,UA Negative Negative, Trace, Small (1+), Moderate (2+), Large (3+), 4+   Urobilinogen, UA     Nitrite, UA neg    Leukocytes, UA Negative Negative   Appearance     Odor     +  Orders Placed This Encounter  Procedures  . Strep Gp B NAA  . GC/Chlamydia Probe Amp  . POC Urinalysis Dipstick OB    Plan:  Continued routine obstetrical care, continue zoloft, reevaluate  dosage in 2 weeks  Return in about 1 week (around 08/21/2018) for [place].

## 2018-08-16 LAB — STREP GP B NAA: Strep Gp B NAA: POSITIVE — AB

## 2018-08-21 LAB — GC/CHLAMYDIA PROBE AMP
Chlamydia trachomatis, NAA: NEGATIVE
Neisseria Gonorrhoeae by PCR: NEGATIVE

## 2018-08-22 ENCOUNTER — Encounter: Payer: Self-pay | Admitting: Women's Health

## 2018-08-22 ENCOUNTER — Ambulatory Visit (INDEPENDENT_AMBULATORY_CARE_PROVIDER_SITE_OTHER): Payer: Medicaid Other | Admitting: Women's Health

## 2018-08-22 ENCOUNTER — Other Ambulatory Visit: Payer: Self-pay

## 2018-08-22 VITALS — BP 106/69 | HR 88 | Wt 179.0 lb

## 2018-08-22 DIAGNOSIS — Z3A37 37 weeks gestation of pregnancy: Secondary | ICD-10-CM

## 2018-08-22 DIAGNOSIS — Z3403 Encounter for supervision of normal first pregnancy, third trimester: Secondary | ICD-10-CM

## 2018-08-22 NOTE — Patient Instructions (Signed)
Rosalene BillingsKristen Barth, I greatly value your feedback.  If you receive a survey following your visit with us today, we appreciate you taking the time to fill it out.  Thanks, Joellyn HaffKim Raenell Mensing, CNM, [facility]  [facility] now [facility] ([address] [place], [place] [address]) Entrance located off of [address] Free 24/7 valet parking   Go to [email or web address] to register for FREE online childbirth classes    Call the office [phone]) or go to [facility] if:  You begin to have strong, frequent contractions  Your water breaks.  Sometimes it is a big gush of fluid, sometimes it is just a trickle that keeps getting your panties wet or running down your legs  You have vaginal bleeding.  It is normal to have a small amount of spotting if your cervix was checked.   You don't feel your baby moving like normal.  If you don't, get you something to eat and drink and lay down and focus on feeling your baby move.  You should feel at least 10 movements in 2 hours.  If you don't, you should call the office or go to [facility].   Home Blood Pressure Monitoring for Patients   Your provider has recommended that you check your blood pressure (BP) at least once a week at home. If you do not have a blood pressure cuff at home, one will be provided for you. Contact your provider if you have not received your monitor within 1 week.   Helpful Tips for Accurate Home Blood Pressure Checks   Don't smoke, exercise, or drink caffeine 30 minutes before checking your BP  Use the restroom before checking your BP (a full bladder can raise your pressure)  Relax in a comfortable upright chair  Feet on the ground  Left arm resting comfortably on a flat surface at the level of your heart  Legs uncrossed  Back supported  Sit quietly and don't talk  Place the cuff on your bare arm  Adjust snuggly, so that only two fingertips can fit between your skin  and the top of the cuff  Check 2 readings separated by at least one minute  Keep a log of your BP readings  For a visual, please reference this diagram: http://ccnc.care/bpdiagram  Provider Name: [facility]     Phone: [phone]  Zone 1: ALL CLEAR  Continue to monitor your symptoms:   BP reading is less than 140 (top number) or less than 90 (bottom number)   No right upper stomach pain  No headaches or seeing spots  No feeling nauseated or throwing up  No swelling in face and hands  Zone 2: CAUTION Call your doctor's office for any of the following:   BP reading is greater than 140 (top number) or greater than 90 (bottom number)   Stomach pain under your ribs in the middle or right side  Headaches or seeing spots  Feeling nauseated or throwing up  Swelling in face and hands  Zone 3: EMERGENCY  Seek immediate medical care if you have any of the following:   BP reading is greater than160 (top number) or greater than 110 (bottom number)  Severe headaches not improving with Tylenol  Serious difficulty catching your breath  Any worsening symptoms from Zone 2    Braxton Hicks Contractions Contractions of the uterus can occur throughout pregnancy, but they are not always a sign that you are in  labor. You may have practice contractions called Braxton Hicks contractions. These false labor contractions are sometimes confused with true labor. What are [place] contractions? Braxton Hicks contractions are tightening movements that occur in the muscles of the uterus before labor. Unlike true labor contractions, these contractions do not result in opening (dilation) and thinning of the cervix. Toward the end of pregnancy (32-34 weeks), Braxton Hicks contractions can happen more often and may become stronger. These contractions are sometimes difficult to tell apart from true labor because they can be very uncomfortable. You should not feel embarrassed if you go to  the hospital with false labor. Sometimes, the only way to tell if you are in true labor is for your health care provider to look for changes in the cervix. The health care provider will do a physical exam and may monitor your contractions. If you are not in true labor, the exam should show that your cervix is not dilating and your water has not broken. If there are no other health problems associated with your pregnancy, it is completely safe for you to be sent home with false labor. You may continue to have Braxton Hicks contractions until you go into true labor. How to tell the difference between true labor and false labor True labor  Contractions last 30-70 seconds.  Contractions become very regular.  Discomfort is usually felt in the top of the uterus, and it spreads to the lower abdomen and low back.  Contractions do not go away with walking.  Contractions usually become more intense and increase in frequency.  The cervix dilates and gets thinner. False labor  Contractions are usually shorter and not as strong as true labor contractions.  Contractions are usually irregular.  Contractions are often felt in the front of the lower abdomen and in the groin.  Contractions may go away when you walk around or change positions while lying down.  Contractions get weaker and are shorter-lasting as time goes on.  The cervix usually does not dilate or become thin. Follow these instructions at home:   Take over-the-counter and prescription medicines only as told by your health care provider.  Keep up with your usual exercises and follow other instructions from your health care provider.  Eat and drink lightly if you think you are going into labor.  If Braxton Hicks contractions are making you uncomfortable: ? Change your position from lying down or resting to walking, or change from walking to resting. ? Sit and rest in a tub of warm water. ? Drink enough fluid to keep your urine  pale yellow. Dehydration may cause these contractions. ? Do slow and deep breathing several times an hour.  Keep all follow-up prenatal visits as told by your health care provider. This is important. Contact a health care provider if:  You have a fever.  You have continuous pain in your abdomen. Get help right away if:  Your contractions become stronger, more regular, and closer together.  You have fluid leaking or gushing from your vagina.  You pass blood-tinged mucus (bloody show).  You have bleeding from your vagina.  You have low back pain that you never had before.  You feel your babys head pushing down and causing pelvic pressure.  Your baby is not moving inside you as much as it used to. Summary  Contractions that occur before labor are called Braxton Hicks contractions, false labor, or practice contractions.  Braxton Hicks contractions are usually shorter, weaker, farther apart, and less  regular than true labor contractions. True labor contractions usually become progressively stronger and regular, and they become more frequent.  Manage discomfort from [facility] contractions by changing position, resting in a warm bath, drinking plenty of water, or practicing deep breathing. This information is not intended to replace advice given to you by your health care provider. Make sure you discuss any questions you have with your health care provider. Document Released: 06/01/2016 Document Revised: 12/29/2016 Document Reviewed: 06/01/2016 Elsevier Patient Education  2020 [employer].

## 2018-08-22 NOTE — Progress Notes (Signed)
   TELEHEALTH VIRTUAL OBSTETRICS VISIT ENCOUNTER NOTE Patient name: Kimberly Kidd MRN [id number]  Date of birth: [birth year]  I connected with patient on 08/22/18 at  3:00 PM EDT by telephone (unable to download Webex) and verified that I am speaking with the correct person using two identifiers. Due to COVID-19 recommendations, pt is not currently in our office.    I discussed the limitations, risks, security and privacy concerns of performing an evaluation and management service by telephone and the availability of in person appointments. I also discussed with the patient that there may be a patient responsible charge related to this service. The patient expressed understanding and agreed to proceed.  Chief Complaint:   Routine Prenatal Visit (pain pelvic area)  History of Present Illness:   Kimberly Kidd is a 23 y.o. G1P0 female at [redacted]w[redacted]d with an Estimated Date of Delivery: 09/09/18 being evaluated today for ongoing management of a low-risk pregnancy.  Today she reports occ sharp pelvic pain. Contractions: Irregular.  .  Movement: Present. denies leaking of fluid. Review of Systems:   Pertinent items are noted in HPI Denies abnormal vaginal discharge w/ itching/odor/irritation, headaches, visual changes, shortness of breath, chest pain, abdominal pain, severe nausea/vomiting, or problems with urination or bowel movements unless otherwise stated above. Pertinent History Reviewed:  Reviewed past medical,surgical, social, obstetrical and family history.  Reviewed problem list, medications and allergies. Physical Assessment:   Vitals:   08/22/18 1458  BP: 106/69  Pulse: 88  Weight: 179 lb (81.2 kg)  Body mass index is 31.71 kg/m.        Physical Examination:   General:  Alert, oriented and cooperative.   Mental Status: Normal mood and affect perceived. Normal judgment and thought content.  Rest of physical exam deferred due to type of encounter  No results found for this or any  previous visit (from the past 24 hour(s)).  Assessment & Plan:  1) Pregnancy G1P0 at [redacted]w[redacted]d with an Estimated Date of Delivery: 09/09/18    Meds: No orders of the defined types were placed in this encounter.   Labs/procedures today: none  Plan:  Continue routine obstetrical care.  Has home bp cuff.  Check bp weekly, let us know if >140/90.   Reviewed: Term labor symptoms and general obstetric precautions including but not limited to vaginal bleeding, contractions, leaking of fluid and fetal movement were reviewed in detail with the patient. The patient was advised to call back or seek an in-person office evaluation/go to [facility] at [facility] for any urgent or concerning symptoms. All questions were answered. Please refer to After Visit Summary for other counseling recommendations.    I provided 15 minutes of non-face-to-face time during this encounter.  Follow-up: Return in about 1 week (around 08/29/2018) for tdap w/ nurse, then 1wk for LROB Webex.  No orders of the defined types were placed in this encounter.  [place], [facility] 08/22/2018 3:19 PM

## 2018-08-23 ENCOUNTER — Ambulatory Visit (INDEPENDENT_AMBULATORY_CARE_PROVIDER_SITE_OTHER): Payer: Medicaid Other | Admitting: *Deleted

## 2018-08-23 DIAGNOSIS — Z23 Encounter for immunization: Secondary | ICD-10-CM | POA: Diagnosis not present

## 2018-08-23 DIAGNOSIS — Z3A37 37 weeks gestation of pregnancy: Secondary | ICD-10-CM

## 2018-08-23 DIAGNOSIS — Z3403 Encounter for supervision of normal first pregnancy, third trimester: Secondary | ICD-10-CM

## 2018-08-23 NOTE — Progress Notes (Signed)
Pt here for Tdap. Pt received shot in left deltoid and tolerated shot well. Return as scheduled. [place]

## 2018-09-02 ENCOUNTER — Ambulatory Visit (INDEPENDENT_AMBULATORY_CARE_PROVIDER_SITE_OTHER): Payer: Medicaid Other | Admitting: Obstetrics and Gynecology

## 2018-09-02 ENCOUNTER — Other Ambulatory Visit: Payer: Self-pay

## 2018-09-02 ENCOUNTER — Encounter: Payer: Self-pay | Admitting: Obstetrics and Gynecology

## 2018-09-02 VITALS — BP 117/72 | HR 76 | Wt 187.6 lb

## 2018-09-02 DIAGNOSIS — Z3A39 39 weeks gestation of pregnancy: Secondary | ICD-10-CM

## 2018-09-02 DIAGNOSIS — Z3403 Encounter for supervision of normal first pregnancy, third trimester: Secondary | ICD-10-CM

## 2018-09-02 DIAGNOSIS — Z331 Pregnant state, incidental: Secondary | ICD-10-CM

## 2018-09-02 DIAGNOSIS — Z1389 Encounter for screening for other disorder: Secondary | ICD-10-CM

## 2018-09-02 LAB — POCT URINALYSIS DIPSTICK OB
Blood, UA: NEGATIVE
Glucose, UA: NEGATIVE
Ketones, UA: NEGATIVE
Leukocytes, UA: NEGATIVE
Nitrite, UA: NEGATIVE
POC,PROTEIN,UA: NEGATIVE

## 2018-09-02 NOTE — Patient Instructions (Signed)

## 2018-09-02 NOTE — Progress Notes (Signed)
Subjective:  Kimberly Kidd is a 23 y.o. G1P0 at [redacted]w[redacted]d being seen today for ongoing prenatal care.  She is currently monitored for the following issues for this low-risk pregnancy and has Generalized anxiety disorder; MDD (major depressive disorder), recurrent episode, severe ([place]); Social anxiety disorder; Post traumatic stress disorder (PTSD); Hx of eating disorder; Suicidal ideation; Supervision of normal first pregnancy; Late prenatal care in second trimester; History of cocaine abuse ([place]); and ASCUS with positive high risk HPV cervical on their problem list.  Patient reports general discomforts of pregnancy.  Contractions: Irregular.  .  Movement: Absent. Denies leaking of fluid.   The following portions of the patient's history were reviewed and updated as appropriate: allergies, current medications, past family history, past medical history, past social history, past surgical history and problem list. Problem list updated.  Objective:   Vitals:   09/02/18 1625  BP: 117/72  Pulse: 76  Weight: 187 lb 9.6 oz (85.1 kg)    Fetal Status:     Movement: Absent     General:  Alert, oriented and cooperative. Patient is in no acute distress.  Skin: Skin is warm and dry. No rash noted.   Cardiovascular: Normal heart rate noted  Respiratory: Normal respiratory effort, no problems with respiration noted  Abdomen: Soft, gravid, appropriate for gestational age. Pain/Pressure: Present     Pelvic:  Cervical exam performed        Extremities: Normal range of motion.  Edema: None  Mental Status: Normal mood and affect. Normal behavior. Normal judgment and thought content.   Urinalysis:      Assessment and Plan:  Pregnancy: G1P0 at [redacted]w[redacted]d  1. Encounter for supervision of normal first pregnancy in third trimester Labor precautions BPP next visit d/t PD Schedule IOL for 41 weeks at next visit  2. Pregnant state, incidental  - POC Urinalysis Dipstick OB  3. Screening for genitourinary  condition  - POC Urinalysis Dipstick OB  Term labor symptoms and general obstetric precautions including but not limited to vaginal bleeding, contractions, leaking of fluid and fetal movement were reviewed in detail with the patient. Please refer to After Visit Summary for other counseling recommendations.  Return in about 1 week (around 09/09/2018) for OB visit, plus BPP d/t PD.   Chancy Milroy, MD

## 2018-09-09 ENCOUNTER — Ambulatory Visit (INDEPENDENT_AMBULATORY_CARE_PROVIDER_SITE_OTHER): Payer: Medicaid Other | Admitting: Obstetrics and Gynecology

## 2018-09-09 ENCOUNTER — Encounter: Payer: Self-pay | Admitting: Obstetrics and Gynecology

## 2018-09-09 ENCOUNTER — Other Ambulatory Visit: Payer: Self-pay

## 2018-09-09 VITALS — BP 124/78 | HR 83 | Wt 185.0 lb

## 2018-09-09 DIAGNOSIS — Z331 Pregnant state, incidental: Secondary | ICD-10-CM

## 2018-09-09 DIAGNOSIS — Z1389 Encounter for screening for other disorder: Secondary | ICD-10-CM

## 2018-09-09 DIAGNOSIS — Z3A40 40 weeks gestation of pregnancy: Secondary | ICD-10-CM

## 2018-09-09 DIAGNOSIS — Z3403 Encounter for supervision of normal first pregnancy, third trimester: Secondary | ICD-10-CM

## 2018-09-09 LAB — POCT URINALYSIS DIPSTICK OB
Blood, UA: NEGATIVE
Glucose, UA: NEGATIVE
Ketones, UA: NEGATIVE
Nitrite, UA: NEGATIVE
POC,PROTEIN,UA: NEGATIVE

## 2018-09-09 NOTE — Progress Notes (Signed)
Induction Assessment Scheduling Form Fax to [facility]:  [phone]  Kimberly Kidd                                                                                   DOB:  03-28-1995                                                            MRN:  [id number]                                                                     Phone #:[phone] (Home Phone)   [phone] (Mobile)                            Provider:  [facility]  GP:  G1P0                                                            Estimated Date of Delivery: 09/09/18  Dating Criteria: lmp     Medical Indications for induction:  Postdates Admission Date/Time:  8/19 6:30 Gestational age on admission:  41w2   Filed Weights   09/09/18 1201  Weight: 185 lb (83.9 kg)   HIV:  Non Reactive (05/08 0900) GBS: Positive (07/15 0000)  0 cm dilated, 20 effaced, -2 station, cervical position posterior, consistency soft   Method of induction(proposed):  Cytotec->FB   Scheduling Provider Signature:  Jonnie Kind, MD                                            Today's Date:  09/09/2018

## 2018-09-09 NOTE — Progress Notes (Addendum)
Patient ID: Kimberly Kidd, female   DOB: 02/10/1995, 23 y.o.   MRN: [id number]    LOW-RISK PREGNANCY VISIT Patient name: Kimberly Kidd MRN [id number]  Date of birth: Nov 20, 2019 Chief Complaint:   Routine Prenatal Visit (back pain, sharp stomach pains off and on)  History of Present Illness:   Kimberly Kidd is a 23 y.o. G1P0 female at [redacted]w[redacted]d with an Estimated Date of Delivery: 09/09/18 being seen today for ongoing management of a low-risk pregnancy. FOB died at 23 due to overdose, believed to be heroin.. Support person will be mother, pt will live with her, has child care items. Pt did not finish HS. Today she reports no complaints. Contractions: Irregular. Vag. Bleeding: None.  Movement: Present. denies leaking of fluid. Review of Systems:   Pertinent items are noted in HPI Denies abnormal vaginal discharge w/ itching/odor/irritation, headaches, visual changes, shortness of breath, chest pain, abdominal pain, severe nausea/vomiting, or problems with urination or bowel movements unless otherwise stated above. Pertinent History Reviewed:  Reviewed past medical,surgical, social, obstetrical and family history.  Reviewed problem list, medications and allergies. Physical Assessment:   Vitals:   09/09/18 1201  BP: 124/78  Pulse: 83  Weight: 185 lb (83.9 kg)  Body mass index is 32.77 kg/m.        Physical Examination:   General appearance: Well appearing, and in no distress  Mental status: Alert, oriented to person, place, and time  Skin: Warm & dry  Cardiovascular: Normal heart rate noted  Respiratory: Normal respiratory effort, no distress  Abdomen: Soft, gravid, nontender  Pelvic: Cervical exam performed posterior long and closed        Extremities: Edema: None  Fetal Status:   Fundal Height: 38 cm Movement: Present    Results for orders placed or performed in visit on 09/09/18 (from the past 24 hour(s))  POC Urinalysis Dipstick OB   Collection Time: 09/09/18 12:02 PM   Result Value Ref Range   Color, UA     Clarity, UA     Glucose, UA Negative Negative   Bilirubin, UA     Ketones, UA neg    Spec Grav, UA     Blood, UA neg    pH, UA     POC,PROTEIN,UA Negative Negative, Trace, Small (1+), Moderate (2+), Large (3+), 4+   Urobilinogen, UA     Nitrite, UA neg    Leukocytes, UA Small (1+) (A) Negative   Appearance     Odor      Assessment & Plan:  1) Low-risk pregnancy G1P0 at [redacted]w[redacted]d with an Estimated Date of Delivery: 09/09/18    Meds: No orders of the defined types were placed in this encounter.  Labs/procedures today: NST  Plan:   IOL 09/18/2018 orders placed/ cytotec NST on 09/16/2018  Reviewed: Term labor symptoms and general obstetric precautions including but not limited to vaginal bleeding, contractions, leaking of fluid and fetal movement were reviewed in detail with the patient.   Follow-up: No follow-ups on file.  Orders Placed This Encounter  Procedures  . POC Urinalysis Dipstick OB   By signing my name below, I, Samul Dada, attest that this documentation has been prepared under the direction and in the presence of Jonnie Kind, MD. Electronically Signed: [place]. 09/09/18. 12:31 PM.  I personally performed the services described in this documentation, which was SCRIBED in my presence. The recorded information has been reviewed and considered accurate. It has been edited as necessary during review. Jonnie Kind,  MD

## 2018-09-10 ENCOUNTER — Telehealth (HOSPITAL_COMMUNITY): Payer: Self-pay | Admitting: *Deleted

## 2018-09-10 NOTE — Telephone Encounter (Signed)
Preadmission screen  

## 2018-09-11 ENCOUNTER — Inpatient Hospital Stay (HOSPITAL_COMMUNITY): Payer: Medicaid Other | Admitting: Anesthesiology

## 2018-09-11 ENCOUNTER — Other Ambulatory Visit: Payer: Self-pay

## 2018-09-11 ENCOUNTER — Telehealth (HOSPITAL_COMMUNITY): Payer: Self-pay | Admitting: *Deleted

## 2018-09-11 ENCOUNTER — Encounter (HOSPITAL_COMMUNITY): Payer: Self-pay

## 2018-09-11 ENCOUNTER — Encounter (HOSPITAL_COMMUNITY): Payer: Self-pay | Admitting: *Deleted

## 2018-09-11 ENCOUNTER — Inpatient Hospital Stay (HOSPITAL_COMMUNITY)
Admission: AD | Admit: 2018-09-11 | Discharge: 2018-09-13 | DRG: 807 | Disposition: A | Discharge status: Home / Self Care | Payer: Medicaid Other | Attending: Obstetrics & Gynecology | Admitting: Obstetrics & Gynecology

## 2018-09-11 DIAGNOSIS — Z3A40 40 weeks gestation of pregnancy: Secondary | ICD-10-CM

## 2018-09-11 DIAGNOSIS — O99824 Streptococcus B carrier state complicating childbirth: Secondary | ICD-10-CM | POA: Diagnosis present

## 2018-09-11 DIAGNOSIS — Z3403 Encounter for supervision of normal first pregnancy, third trimester: Secondary | ICD-10-CM

## 2018-09-11 DIAGNOSIS — O4292 Full-term premature rupture of membranes, unspecified as to length of time between rupture and onset of labor: Secondary | ICD-10-CM | POA: Diagnosis present

## 2018-09-11 DIAGNOSIS — O0932 Supervision of pregnancy with insufficient antenatal care, second trimester: Secondary | ICD-10-CM

## 2018-09-11 DIAGNOSIS — O4202 Full-term premature rupture of membranes, onset of labor within 24 hours of rupture: Secondary | ICD-10-CM | POA: Diagnosis not present

## 2018-09-11 DIAGNOSIS — Z20828 Contact with and (suspected) exposure to other viral communicable diseases: Secondary | ICD-10-CM | POA: Diagnosis present

## 2018-09-11 DIAGNOSIS — Z87891 Personal history of nicotine dependence: Secondary | ICD-10-CM | POA: Diagnosis not present

## 2018-09-11 DIAGNOSIS — O480 Post-term pregnancy: Secondary | ICD-10-CM | POA: Diagnosis present

## 2018-09-11 DIAGNOSIS — Z3A41 41 weeks gestation of pregnancy: Secondary | ICD-10-CM | POA: Diagnosis present

## 2018-09-11 DIAGNOSIS — Z3490 Encounter for supervision of normal pregnancy, unspecified, unspecified trimester: Secondary | ICD-10-CM

## 2018-09-11 DIAGNOSIS — R87610 Atypical squamous cells of undetermined significance on cytologic smear of cervix (ASC-US): Secondary | ICD-10-CM

## 2018-09-11 DIAGNOSIS — F1411 Cocaine abuse, in remission: Secondary | ICD-10-CM

## 2018-09-11 LAB — RAPID URINE DRUG SCREEN, HOSP PERFORMED
Amphetamines: NOT DETECTED
Barbiturates: NOT DETECTED
Benzodiazepines: NOT DETECTED
Cocaine: NOT DETECTED
Opiates: NOT DETECTED
Tetrahydrocannabinol: NOT DETECTED

## 2018-09-11 LAB — POCT FERN TEST: POCT Fern Test: POSITIVE

## 2018-09-11 LAB — CBC
HCT: 41.8 % (ref 36.0–46.0)
Hemoglobin: 14 g/dL (ref 12.0–15.0)
MCH: 29.9 pg (ref 26.0–34.0)
MCHC: 33.5 g/dL (ref 30.0–36.0)
MCV: 89.1 fL (ref 80.0–100.0)
Platelets: 274 10*3/uL (ref 150–400)
RBC: 4.69 MIL/uL (ref 3.87–5.11)
RDW: 12.9 % (ref 11.5–15.5)
WBC: 13.2 10*3/uL — ABNORMAL HIGH (ref 4.0–10.5)
nRBC: 0 % (ref 0.0–0.2)

## 2018-09-11 LAB — SARS CORONAVIRUS 2 BY RT PCR (HOSPITAL ORDER, PERFORMED IN ~~LOC~~ HOSPITAL LAB): SARS Coronavirus 2: NEGATIVE

## 2018-09-11 LAB — TYPE AND SCREEN
ABO/RH(D): A POS
Antibody Screen: NEGATIVE

## 2018-09-11 LAB — ABO/RH: ABO/RH(D): A POS

## 2018-09-11 MED ORDER — OXYCODONE-ACETAMINOPHEN 5-325 MG PO TABS: 1.0000 | ORAL_TABLET | ORAL | Status: DC | PRN

## 2018-09-11 MED ORDER — OXYTOCIN BOLUS FROM INFUSION
500.0000 mL | Freq: Once | INTRAVENOUS | Status: AC
  Administered 2018-09-12: 500 mL via INTRAVENOUS

## 2018-09-11 MED ORDER — OXYTOCIN 40 UNITS IN NORMAL SALINE INFUSION - SIMPLE MED
1.0000 m[IU]/min | INTRAVENOUS | Status: DC
  Administered 2018-09-11: 23:00:00 2 m[IU]/min via INTRAVENOUS
  Filled 2018-09-11: qty 1000

## 2018-09-11 MED ORDER — ONDANSETRON HCL 4 MG/2ML IJ SOLN
4.0000 mg | Freq: Four times a day (QID) | INTRAMUSCULAR | Status: DC | PRN
  Administered 2018-09-11: 20:00:00 4 mg via INTRAVENOUS
  Filled 2018-09-11: qty 2

## 2018-09-11 MED ORDER — OXYCODONE-ACETAMINOPHEN 5-325 MG PO TABS: 2.0000 | ORAL_TABLET | ORAL | Status: DC | PRN

## 2018-09-11 MED ORDER — TERBUTALINE SULFATE 1 MG/ML IJ SOLN: 0.2500 mg | Freq: Once | INTRAMUSCULAR | Status: DC | PRN

## 2018-09-11 MED ORDER — SODIUM CHLORIDE 0.9 % IV SOLN
5.0000 10*6.[IU] | Freq: Once | INTRAVENOUS | Status: AC
  Administered 2018-09-11: 5 10*6.[IU] via INTRAVENOUS
  Filled 2018-09-11: qty 5

## 2018-09-11 MED ORDER — FENTANYL CITRATE (PF) 100 MCG/2ML IJ SOLN
100.0000 ug | INTRAMUSCULAR | Status: DC | PRN
  Administered 2018-09-11: 100 ug via INTRAVENOUS
  Filled 2018-09-11: qty 2

## 2018-09-11 MED ORDER — FENTANYL-BUPIVACAINE-NACL 0.5-0.125-0.9 MG/250ML-% EP SOLN
12.0000 mL/h | EPIDURAL | Status: DC | PRN
  Filled 2018-09-11: qty 250

## 2018-09-11 MED ORDER — PENICILLIN G 3 MILLION UNITS IVPB - SIMPLE MED
3.0000 10*6.[IU] | INTRAVENOUS | Status: DC
  Administered 2018-09-11 (×2): 3 10*6.[IU] via INTRAVENOUS
  Filled 2018-09-11 (×2): qty 100

## 2018-09-11 MED ORDER — LACTATED RINGERS IV SOLN
INTRAVENOUS | Status: DC
  Administered 2018-09-11 (×2): via INTRAVENOUS

## 2018-09-11 MED ORDER — LACTATED RINGERS IV SOLN
500.0000 mL | Freq: Once | INTRAVENOUS | Status: AC
  Administered 2018-09-11: 500 mL via INTRAVENOUS

## 2018-09-11 MED ORDER — LACTATED RINGERS IV SOLN
INTRAVENOUS | Status: DC
  Administered 2018-09-12: 01:00:00 via INTRAVENOUS

## 2018-09-11 MED ORDER — EPHEDRINE 5 MG/ML INJ: 10.0000 mg | INTRAVENOUS | Status: DC | PRN

## 2018-09-11 MED ORDER — SOD CITRATE-CITRIC ACID 500-334 MG/5ML PO SOLN: 30.0000 mL | ORAL | Status: DC | PRN

## 2018-09-11 MED ORDER — FLEET ENEMA 7-19 GM/118ML RE ENEM: 1.0000 | ENEMA | RECTAL | Status: DC | PRN

## 2018-09-11 MED ORDER — LACTATED RINGERS IV SOLN
500.0000 mL | INTRAVENOUS | Status: DC | PRN
  Administered 2018-09-12 (×2): 500 mL via INTRAVENOUS

## 2018-09-11 MED ORDER — PHENYLEPHRINE 40 MCG/ML (10ML) SYRINGE FOR IV PUSH (FOR BLOOD PRESSURE SUPPORT): 80.0000 ug | PREFILLED_SYRINGE | INTRAVENOUS | Status: DC | PRN

## 2018-09-11 MED ORDER — ACETAMINOPHEN 325 MG PO TABS: 650.0000 mg | ORAL_TABLET | ORAL | Status: DC | PRN

## 2018-09-11 MED ORDER — LIDOCAINE HCL (PF) 1 % IJ SOLN: 30.0000 mL | INTRAMUSCULAR | Status: DC | PRN

## 2018-09-11 MED ORDER — DIPHENHYDRAMINE HCL 50 MG/ML IJ SOLN: 12.5000 mg | INTRAMUSCULAR | Status: DC | PRN

## 2018-09-11 MED ORDER — OXYTOCIN 40 UNITS IN NORMAL SALINE INFUSION - SIMPLE MED
2.5000 [IU]/h | INTRAVENOUS | Status: DC
  Administered 2018-09-12: 04:00:00 2.5 [IU]/h via INTRAVENOUS

## 2018-09-11 NOTE — Anesthesia Procedure Notes (Signed)
Epidural Patient location during procedure: OB Start time: 09/11/2018 10:00 PM End time: 09/11/2018 10:19 PM  Staffing Anesthesiologist: Lynda Rainwater, MD Performed: anesthesiologist   Preanesthetic Checklist Completed: patient identified, site marked, surgical consent, pre-op evaluation, timeout performed, IV checked, risks and benefits discussed and monitors and equipment checked  Epidural Patient position: sitting Prep: ChloraPrep Patient monitoring: heart rate, cardiac monitor, continuous pulse ox and blood pressure Approach: midline Location: L2-L3 Injection technique: LOR saline  Needle:  Needle type: Tuohy  Needle gauge: 17 G Needle length: 9 cm Needle insertion depth: 5 cm Catheter type: closed end flexible Catheter size: 20 Guage Catheter at skin depth: 9 cm Test dose: negative  Assessment Events: blood not aspirated, injection not painful, no injection resistance, negative IV test and no paresthesia  Additional Notes Reason for block:procedure for pain

## 2018-09-11 NOTE — Telephone Encounter (Signed)
Preadmission screen  

## 2018-09-11 NOTE — Progress Notes (Signed)
Kimberly Kidd is a 23 y.o. G1P0 at [redacted]w[redacted]d   Subjective: Patient in right lateral, clutching bed rail, reporting 10/10 contraction pain. Requesting epidural.  Objective: BP 108/67   Pulse (!) 101   Temp 98.1 F (36.7 C) (Oral)   Resp 19   Ht 5\' 3"  (1.6 m)   Wt 84.9 kg   LMP 12/03/2017 (Approximate)   SpO2 96%   BMI 33.14 kg/m  I/O last 3 completed shifts: In: -  Out: 200 [Urine:200] No intake/output data recorded.  FHT:  FHR: 145 bpm, variability: moderate,  accelerations:  Present,  decelerations:  Absent UC:   irregular, every 2-4 minutes SVE:   Dilation: 3 Effacement (%): 100 Station: -2 Exam by:: Mary Martinique Johnson, RN   Labs: Lab Results  Component Value Date   WBC 13.2 (H) 09/11/2018   HGB 14.0 09/11/2018   HCT 41.8 09/11/2018   MCV 89.1 09/11/2018   PLT 274 09/11/2018    Assessment / Plan: --23 y.o. G1P0 at [redacted]w[redacted]d  --Category I tracing --GBS + (PCN) --Patient may have epidural now --Start Pitocin 2 x 2 when comfortable --Anticipate NSVD  Darlina Rumpf, CNM 09/11/2018, 9:41 PM

## 2018-09-11 NOTE — H&P (Addendum)
LABOR AND DELIVERY ADMISSION HISTORY AND PHYSICAL NOTE  Kimberly Kidd is a 23 y.o.22 y.o. female G1P0 with IUP at 4053w2d by LMP c/w 16wk U/S presenting for PROM @1200 . She reports positive fetal movement. She denies leakage of fluid or vaginal bleeding.  Prenatal History/Complications: PNC at [facility] P[facility] complications:  - [facility] - hx of anxiety  - hx of suicidal ideation  Past Medical History: Past Medical History:  Diagnosis Date  . Anxiety   . Asthma   . Hx of eating disorder 11/06/2014  . Post traumatic stress disorder (PTSD) 11/06/2014  . Social anxiety disorder 11/06/2014  . Suicidal ideation 11/06/2014    Past Surgical History: Past Surgical History:  Procedure Laterality Date  . TONSILLECTOMY    . tubes in ears      Obstetrical History: OB History    Gravida  1   Para      Term      Preterm      AB      Living        SAB      TAB      Ectopic      Multiple      Live Births              Social History: Social History   Socioeconomic History  . Marital status: Significant Other    Spouse name: Not on file  . Number of children: Not on file  . Years of education: Not on file  . Highest education level: Not on file  Occupational History  . Not on file  Social Needs  . Financial resource strain: Not hard at all  . Food insecurity    Worry: Never true    Inability: Never true  . Transportation needs    Medical: No    Non-medical: No  Tobacco Use  . Smoking status: Former Smoker    Packs/day: 0.05    Types: Cigarettes  . Smokeless tobacco: Former NeurosurgeonUser    Types: Snuff  Substance and Sexual Activity  . Alcohol use: Not Currently  . Drug use: Not Currently    Types: Cocaine    Comment: "clean 2 years"  . Sexual activity: Yes    Birth control/protection: None  Lifestyle  . Physical activity    Days per week: Not on file    Minutes per session: Not on file  . Stress: Not at all  Relationships  . Social Wellsite geologistconnections     Talks on phone: Not on file    Gets together: Not on file    Attends religious service: Not on file    Active member of club or organization: Not on file    Attends meetings of clubs or organizations: Not on file    Relationship status: Not on file  Other Topics Concern  . Not on file  Social History Narrative  . Not on file    Family History: Family History  Problem Relation Age of Onset  . Alcohol abuse Maternal Grandfather   . Drug abuse Mother   . Bipolar disorder Mother   . Other Father        murdered  . Bipolar disorder Maternal Aunt   . Schizophrenia Maternal Uncle   . Cancer Paternal Grandfather   . Other Maternal Grandmother        open heart surgery  . Diabetes Maternal Grandmother     Allergies: No Known Allergies  Medications Prior to Admission  Medication  Sig Dispense Refill Last Dose  . prenatal vitamin w/FE, FA (PRENATAL 1 + 1) 27-1 MG TABS tablet Take 1 tablet by mouth daily at 12 noon. 30 each 12   . sertraline (ZOLOFT) 50 MG tablet Take 1 tablet (50 mg total) by mouth daily. 30 tablet 6      Review of Systems  All systems reviewed and negative except as stated in HPI  Physical Exam Blood pressure 122/74, pulse 87, temperature 98 F (36.7 C), temperature source Oral, resp. rate 17, last menstrual period 12/03/2017, SpO2 98 %. General appearance: alert, oriented, NAD Lungs: normal respiratory effort Heart: regular rate Abdomen: soft, non-tender; gravid, FH appropriate for GA Extremities: No calf swelling or tenderness Presentation: cephalic Fetal monitoring: 130 bpm moderate variability 15 x 15 acels no decels  Uterine activity: irritability Dilation: 3 Effacement (%): 80 Station: -3 Exam by:: Dr. Janus Molder  Prenatal labs: ABO, Rh: A/Positive/-- (03/13 1124) Antibody: Negative (05/08 0900) Rubella: 1.26 (03/13 1124) RPR: Non Reactive (05/08 0900)  HBsAg: Negative (03/13 1124)  HIV: Non Reactive (05/08 0900)  GC/Chlamydia:  Negative GBS: Positive (07/15 0000)  2-hr GTT: 86 Genetic screening: Low risk Anatomy US: WNL  Prenatal Transfer Tool  Maternal Diabetes: No Genetic Screening: Normal Maternal Ultrasounds/Referrals: Normal Fetal Ultrasounds or other Referrals:  None Maternal Substance Abuse:  No  Significant Maternal Medications:  None Significant Maternal Lab Results: Group B Strep positive  Results for orders placed or performed during the hospital encounter of 09/11/18 (from the past 24 hour(s))  POCT fern test   Collection Time: 09/11/18  2:23 PM  Result Value Ref Range   POCT Fern Test Positive = ruptured amniotic membanes     Patient Active Problem List   Diagnosis Date Noted  . Post term pregnancy at [redacted] weeks gestation 09/11/2018  . ASCUS with positive high risk HPV cervical 04/18/2018  . Supervision of normal first pregnancy 04/11/2018  . Late prenatal care in second trimester 04/11/2018  . History of cocaine abuse ([place]) 04/11/2018  . MDD (major depressive disorder), recurrent episode, severe ([place]) 11/06/2014  . Social anxiety disorder 11/06/2014  . Post traumatic stress disorder (PTSD) 11/06/2014  . Hx of eating disorder 11/06/2014  . Suicidal ideation 11/06/2014  . Generalized anxiety disorder 12/23/2010    Assessment: Kimberly Kidd is a 23 y.o. G1P0 at [redacted]w[redacted]d here for PROM.  #Labor: Start Pitocin 39mU/min. Continue time appropriate cervical exams. #Pain: Maternally supported. Desires epidural. #FWB: Cat I, Vertex on exam, EFW by leopold's 6lbs #ID: GBS PCN ppx #MOF: Breast #MOC: POPs    Kimberly Fee, DO Family Medicine PGY-1 09/11/2018, 3:06 PM   I confirm that I have verified the information documented in the resident's note and that I have also personally reperformed the history, physical exam and all medical decision making activities of this service and have verified that all service and findings are accurately documented in this student's note.   --Category I  tracing: baseline 150, mod variability, pos accels, no deceks --Toco: irregular ctx q 2-3 min  Postpartum planning: inpatient social work consult, outpatient 2 week postpartum mood check  Kimberly Kidd, [place] 09/11/2018 8:24 PM

## 2018-09-11 NOTE — MAU Note (Signed)
Covid swab collected. PT tolerated well. PT asymptomatic 

## 2018-09-11 NOTE — MAU Note (Signed)
Kimberly Kidd is a 23 y.o. at [redacted]w[redacted]d here in [facility]U reporting: reports LOF for the past hour, had a big gush, still leaking, it's been clear. Having some contractions, unsure about how often they come. +FM  Onset of complaint: the past hour  Pain score: 6/10  Vitals:   09/11/18 1405  BP: 122/74  Pulse: 87  Resp: 17  Temp: 98 F (36.7 C)  SpO2: 98%     Lab orders placed from triage: none

## 2018-09-11 NOTE — Anesthesia Preprocedure Evaluation (Signed)
Anesthesia Evaluation  Patient identified by MRN, date of birth, ID band Patient awake    Reviewed: Allergy & Precautions, NPO status , Patient's Chart, lab work & pertinent test results  Airway Mallampati: II  TM Distance: >3 FB Neck ROM: Full    Dental no notable dental hx.    Pulmonary neg pulmonary ROS, former smoker,    Pulmonary exam normal breath sounds clear to auscultation       Cardiovascular negative cardio ROS Normal cardiovascular exam Rhythm:Regular Rate:Normal     Neuro/Psych negative neurological ROS  negative psych ROS   GI/Hepatic negative GI ROS, Neg liver ROS,   Endo/Other  negative endocrine ROS  Renal/GU negative Renal ROS  negative genitourinary   Musculoskeletal negative musculoskeletal ROS (+)   Abdominal (+) + obese,   Peds negative pediatric ROS (+)  Hematology negative hematology ROS (+)   Anesthesia Other Findings   Reproductive/Obstetrics (+) Pregnancy                             Anesthesia Physical Anesthesia Plan  ASA: II  Anesthesia Plan: Epidural   Post-op Pain Management:    Induction: Intravenous  PONV Risk Score and Plan:   Airway Management Planned:   Additional Equipment:   Intra-op Plan:   Post-operative Plan:   Informed Consent: I have reviewed the patients History and Physical, chart, labs and discussed the procedure including the risks, benefits and alternatives for the proposed anesthesia with the patient or authorized representative who has indicated his/her understanding and acceptance.     Dental advisory given  Plan Discussed with: CRNA  Anesthesia Plan Comments:         Anesthesia Quick Evaluation

## 2018-09-12 ENCOUNTER — Encounter (HOSPITAL_COMMUNITY): Payer: Self-pay | Admitting: *Deleted

## 2018-09-12 DIAGNOSIS — Z3A40 40 weeks gestation of pregnancy: Secondary | ICD-10-CM

## 2018-09-12 DIAGNOSIS — O480 Post-term pregnancy: Secondary | ICD-10-CM

## 2018-09-12 LAB — RPR: RPR Ser Ql: NONREACTIVE

## 2018-09-12 MED ORDER — IBUPROFEN 600 MG PO TABS: 600.0000 mg | ORAL_TABLET | Freq: Once | ORAL | Status: DC

## 2018-09-12 MED ORDER — SIMETHICONE 80 MG PO CHEW: 80.0000 mg | CHEWABLE_TABLET | ORAL | Status: DC | PRN

## 2018-09-12 MED ORDER — MAGNESIUM HYDROXIDE 400 MG/5ML PO SUSP: 30.0000 mL | ORAL | Status: DC | PRN

## 2018-09-12 MED ORDER — DIPHENHYDRAMINE HCL 25 MG PO CAPS: 25.0000 mg | ORAL_CAPSULE | Freq: Four times a day (QID) | ORAL | Status: DC | PRN

## 2018-09-12 MED ORDER — COCONUT OIL OIL: 1.0000 "application " | TOPICAL_OIL | Status: DC | PRN

## 2018-09-12 MED ORDER — ACETAMINOPHEN 325 MG PO TABS: 650.0000 mg | ORAL_TABLET | ORAL | Status: DC | PRN

## 2018-09-12 MED ORDER — ONDANSETRON HCL 4 MG/2ML IJ SOLN: 4.0000 mg | INTRAMUSCULAR | Status: DC | PRN

## 2018-09-12 MED ORDER — PRENATAL MULTIVITAMIN CH
1.0000 | ORAL_TABLET | Freq: Every day | ORAL | Status: DC
  Administered 2018-09-12 – 2018-09-13 (×2): 1 via ORAL
  Filled 2018-09-12 (×2): qty 1

## 2018-09-12 MED ORDER — OXYCODONE-ACETAMINOPHEN 5-325 MG PO TABS: 2.0000 | ORAL_TABLET | ORAL | Status: DC | PRN

## 2018-09-12 MED ORDER — FERROUS SULFATE 325 (65 FE) MG PO TABS
325.0000 mg | ORAL_TABLET | Freq: Two times a day (BID) | ORAL | Status: DC
  Administered 2018-09-12 – 2018-09-13 (×3): 325 mg via ORAL
  Filled 2018-09-12 (×3): qty 1

## 2018-09-12 MED ORDER — IBUPROFEN 600 MG PO TABS
600.0000 mg | ORAL_TABLET | Freq: Four times a day (QID) | ORAL | Status: DC
  Administered 2018-09-12 – 2018-09-13 (×6): 600 mg via ORAL
  Filled 2018-09-12 (×6): qty 1

## 2018-09-12 MED ORDER — OXYCODONE-ACETAMINOPHEN 5-325 MG PO TABS: 1.0000 | ORAL_TABLET | ORAL | Status: DC | PRN

## 2018-09-12 MED ORDER — TETANUS-DIPHTH-ACELL PERTUSSIS 5-2.5-18.5 LF-MCG/0.5 IM SUSP: 0.5000 mL | Freq: Once | INTRAMUSCULAR | Status: DC

## 2018-09-12 MED ORDER — WITCH HAZEL-GLYCERIN EX PADS: 1.0000 "application " | MEDICATED_PAD | CUTANEOUS | Status: DC | PRN

## 2018-09-12 MED ORDER — ONDANSETRON HCL 4 MG PO TABS: 4.0000 mg | ORAL_TABLET | ORAL | Status: DC | PRN

## 2018-09-12 MED ORDER — ZOLPIDEM TARTRATE 5 MG PO TABS: 5.0000 mg | ORAL_TABLET | Freq: Every evening | ORAL | Status: DC | PRN

## 2018-09-12 MED ORDER — DIBUCAINE (PERIANAL) 1 % EX OINT: 1.0000 "application " | TOPICAL_OINTMENT | CUTANEOUS | Status: DC | PRN

## 2018-09-12 MED ORDER — MEASLES, MUMPS & RUBELLA VAC IJ SOLR: 0.5000 mL | Freq: Once | INTRAMUSCULAR | Status: DC

## 2018-09-12 MED ORDER — BENZOCAINE-MENTHOL 20-0.5 % EX AERO
1.0000 "application " | INHALATION_SPRAY | CUTANEOUS | Status: DC | PRN
  Administered 2018-09-12: 1 via TOPICAL
  Filled 2018-09-12: qty 56

## 2018-09-12 NOTE — Discharge Summary (Signed)
Obstetrics Discharge Summary OB/GYN Faculty Practice   Patient Name: Kimberly BillingsKristen Shorten DOB: 21-Mar-1995 MRN: [id number]  Date of admission: 09/11/2018 Delivering MD: Calvert CantorWEINHOLD, SAMANTHA C   Date of discharge: 09/13/2018  Admitting diagnosis: [place] Ctx Intrauterine pregnancy: 5952w3d     Secondary diagnosis:   Active Problems:   Post term pregnancy at [redacted] weeks gestation   Pregnancy   Additional problems:  .      Discharge diagnosis: Term Pregnancy Delivered                                            Postpartum procedures: None  Complications: None  Outpatient Follow-Up: --mood check in two weeks --postpartum appt 4 weeks  Hospital course: Kimberly BillingsKristen Garofano is a 23 y.o. 11052w3d who was admitted for PROM. Her pregnancy was complicated by the following: Generalized anxiety disorder; MDD (major depressive disorder), recurrent episode, severe (H[facility]); Social anxiety disorder; Post traumatic stress disorder (PTSD); Hx of eating disorder; Suicidal ideation; Supervision of normal first pregnancy; Late prenatal care in second trimester; History of cocaine abuse ([facility]); ASCUS with positive high risk HPV cervical; Post term pregnancy at [redacted] weeks gestation; and Pregnancy on their problem list. . Her labor course was notable for Category II tracing on Pitocin. Delivery was uncomplicated. Please see delivery/op note for additional details. Her postpartum course was uncomplicated. She was breastfeeding without difficulty. By day of discharge, she was passing flatus, urinating, eating and drinking without difficulty. Her pain was well-controlled, and she was discharged home. She will follow-up in clinic in 2 weeks for a mood check.   Physical exam  Vitals:   09/12/18 1127 09/12/18 1530 09/12/18 1956 09/13/18 0639  BP: (!) 102/55 106/64 113/63 109/75  Pulse: 88 65 66 68  Resp: 18 18 18 18   Temp: 98.4 F (36.9 C) 98.6 F (37 C) 98.6 F (37 C)   TempSrc: Oral Oral Oral   SpO2: 97% 99%    Weight:       Height:       General: NAD Lochia: appropriate Uterine Fundus: firm Incision: N/A DVT Evaluation: No evidence of DVT seen on physical exam. Labs: Lab Results  Component Value Date   WBC 13.2 (H) 09/11/2018   HGB 14.0 09/11/2018   HCT 41.8 09/11/2018   MCV 89.1 09/11/2018   PLT 274 09/11/2018   CMP Latest Ref Rng & Units 10/11/2017  Glucose 70 - 99 mg/dL 98  BUN 6 - 20 mg/dL 11  Creatinine 7.820.44 - 9.561.00 mg/dL 2.130.85  Sodium 086135 - 578145 mmol/L 139  Potassium 3.5 - 5.1 mmol/L 3.7  Chloride 98 - 111 mmol/L 108  CO2 22 - 32 mmol/L 24  Calcium 8.9 - 10.3 mg/dL 9.0  Total Protein 6.5 - 8.1 g/dL 7.0  Total Bilirubin 0.3 - 1.2 mg/dL 1.2  Alkaline Phos 38 - 126 U/L 47  AST 15 - 41 U/L 21  ALT 0 - 44 U/L 16    Discharge instructions: Per After Visit Summary and "Baby and Me Booklet"  After visit meds:  Allergies as of 09/13/2018   No Known Allergies     Medication List    TAKE these medications   acetaminophen 325 MG tablet Commonly known as: Tylenol Take 2 tablets (650 mg total) by mouth every 4 (four) hours as needed (for pain scale < 4).   ibuprofen 600 MG tablet Commonly known as:  ADVIL Take 1 tablet (600 mg total) by mouth every 6 (six) hours.   prenatal vitamin w/FE, FA 27-1 MG Tabs tablet Take 1 tablet by mouth daily at 12 noon.   sertraline 50 MG tablet Commonly known as: Zoloft Take 1 tablet (50 mg total) by mouth daily.       Postpartum contraception: Progesterone only pills, counseled on importance of taking at same time Diet: Routine Diet Activity: Advance as tolerated. Pelvic rest for 6 weeks.   Follow-up Appt: Future Appointments  Date Time Provider [place]  10/17/2018 11:30 AM Roma Schanz, CNM [facility]   Follow-up Visit:No follow-ups on file.  Newborn Data: Live born female  Birth Weight: 6 lb 14.8 oz (3140 g) APGAR: 62, 9  Newborn Delivery   Birth date/time: 09/12/2018 03:53:00 Delivery type: Vaginal, Spontaneous       Baby Feeding: Breast Disposition:home with mother       \

## 2018-09-12 NOTE — Discharge Instructions (Signed)

## 2018-09-12 NOTE — Progress Notes (Signed)
Kimberly Kidd is a 23 y.o. G1P0 at [redacted]w[redacted]d   Subjective: S/p epidural, endorses some mild suprapubic pressure c/w location of catheter.  Objective: BP 102/62   Pulse 83   Temp 99.1 F (37.3 C) (Axillary)   Resp 16   Ht 5\' 3"  (1.6 m)   Wt 84.9 kg   LMP 12/03/2017 (Approximate)   SpO2 96%   BMI 33.14 kg/m  I/O last 3 completed shifts: In: -  Out: 200 [Urine:200] No intake/output data recorded.  FHT:  FHR: 125 bpm, variability: moderate,  accelerations:  Present,  decelerations:  Present late and early decels UC:   irregular, every 2-4 minutes SVE:   Dilation: 5.5-6 Effacement (%): 100 Station: .1 Exam by:: Maryelizabeth Kaufmann, CNM  Labs: Lab Results  Component Value Date   WBC 13.2 (H) 09/11/2018   HGB 14.0 09/11/2018   HCT 41.8 09/11/2018   MCV 89.1 09/11/2018   PLT 274 09/11/2018    Assessment / Plan: Patient now 24 hours s/p ROM. Afebrile, clear fluid Pitocin infusing at 2 milliunits since 2300 hours Pitocin off for recurrent decelerations, resolved with 523mL fluid bolus and repositioning to far left lateral Discussed recovery period, evaluation for spontaneous change without augmentation Patient to reposition with use of peanut ball as tolerated Now 5.5 to 6cm, very thin and noticeable improvement in engagement Plan to recheck in 3-4 hours barring increased acuity Anticipate NSVD  Darlina Rumpf, CNM 09/12/2018, 12:56 AM

## 2018-09-12 NOTE — Anesthesia Postprocedure Evaluation (Signed)
Anesthesia Post Note  Patient: Kimberly Kidd  Procedure(s) Performed: AN AD [place]     Patient location during evaluation: Mother Baby Anesthesia Type: Epidural Level of consciousness: awake Pain management: satisfactory to patient Vital Signs Assessment: post-procedure vital signs reviewed and stable Respiratory status: spontaneous breathing Cardiovascular status: stable Anesthetic complications: no    Last Vitals:  Vitals:   09/12/18 0730 09/12/18 1127  BP: 107/68 (!) 102/55  Pulse: 70 88  Resp: 16 18  Temp: 36.9 C 36.9 C  SpO2: 98% 97%    Last Pain:  Vitals:   09/12/18 1127  TempSrc: Oral  PainSc: 0-No pain   Pain Goal: Patients Stated Pain Goal: 3 (09/11/18 2029)                 Casimer Lanius

## 2018-09-12 NOTE — Lactation Note (Signed)
This note was copied from a baby's chart. Lactation Consultation Note Baby 3 hrs old. Mom has tubular breast. Breast heavy, full feeling, tender, edema to bulbous areola. Nipple everted until compressed, flattens. Reverse pressure not helpful. Mom grimacing w/Breasts hand expression or breast massage. Mom has thick colostrum. Not tolerating hand expression well. Shells given to wear to lessen edema.  Hand pump given for pre-pumping to evert nipples. Baby BF on breast well in football position. RN assisted in latching.  Newborn behavior, STS, I&O, feeding habits, breast massage, milk storage, supply and demand discussed. Mom encouraged to feed baby 8-12 times/24 hours and with feeding cues. Mom encouraged to waken baby for feeds in 3 hrs. Encouraged mom to call for assistance or questions. Mom appears to be timid. Lactation brochure given.     Patient Name: Girl Jodelle Fausto [id number] Date: 09/12/2018 Reason for consult: Initial assessment;Primapara;Term   Maternal Data Has patient been taught Hand Expression?: Yes Does the patient have breastfeeding experience prior to this delivery?: No  Feeding Feeding Type: Breast Fed  LATCH Score Latch: Grasps breast easily, tongue down, lips flanged, rhythmical sucking.  Audible Swallowing: A few with stimulation  Type of Nipple: Everted at rest and after stimulation(compresses flat/everted at rest)  Comfort (Breast/Nipple): Filling, red/small blisters or bruises, mild/mod discomfort(edema)  Hold (Positioning): Assistance needed to correctly position infant at breast and maintain latch.  LATCH Score: 7  Interventions Interventions: Breast feeding basics reviewed;Support pillows;Skin to skin;Position options;Breast massage;Hand express;Pre-pump if needed;Shells;Breast compression;Reverse pressure;Hand pump  Lactation Tools Discussed/Used Tools: Pump;Shells Shell Type: Inverted Breast pump type: Manual Pump Review: Setup,  frequency, and cleaning;Milk Storage Initiated by:: Allayne Stack RN IBCLC Date initiated:: 09/12/18   Consult Status Consult Status: Follow-up Date: 09/12/18(in pm) Follow-up type: In-patient    Lorinda Copland, [place] 09/12/2018, 7:04 AM

## 2018-09-13 MED ORDER — ACETAMINOPHEN 325 MG PO TABS: 650.0000 mg | ORAL_TABLET | ORAL | 0 refills | Status: DC | PRN

## 2018-09-13 MED ORDER — IBUPROFEN 600 MG PO TABS: 600.0000 mg | ORAL_TABLET | Freq: Four times a day (QID) | ORAL | 0 refills | Status: DC

## 2018-09-13 NOTE — Lactation Note (Signed)
This note was copied from a baby's chart. Lactation Consultation Note  Patient Name: Kimberly Kidd Date: 09/13/2018 Reason for consult: Follow-up assessment;Infant weight loss;Primapara;1st time breastfeeding;Difficult latch(only on the left breast using a NS) Baby is 32 hours old / 4 % weight loss / @ 25 hours Serum Bili 2.8.  Mom and baby for early D/C .  Per mom the breast feeding is going well.  Using the Nipple Shield #20 only on left nipple . Not on the right and hearing swallows.  Mom has hand pump for D/C and has only been using it. LC recommended  Calling [facility] to obtain a DEBP loaner and [place] provided mom with the DEBP kit,  Extra #20 NS, and a curved tip syringe to instill EBM into the top of the NS.  Mom denies sore nipples. Sore nipples and engorgement prevention and tx reviewed. Per mom has been using shells between feedings.  LC offered to request mom and LC O/P appt and mom receptive for next week.  LC requested it in EPIc basket for the clinic.  [place] faxed a DEBP request to [facility] - and mom aware. LC recommended mom call [facility] to check on a DEBP loaner.  LC encouraged mom to call with breast feeding questions if needed.  Mom aware of the Lactation resources after D/C.   Maternal Data    Feeding Feeding Type: Breast Fed  LATCH Score                   Interventions Interventions: Breast feeding basics reviewed  Lactation Tools Discussed/Used Tools: (per mom using the NS on left - fed on the right ) WIC Program: Yes Pump Review: Milk Storage   Consult Status Consult Status: Follow-up Follow-up type: [place] 09/13/2018, 12:04 PM

## 2018-09-13 NOTE — Lactation Note (Signed)
This note was copied from a baby's chart. Lactation Consultation Note  Patient Name: Kimberly Kidd [id number] Date: 09/13/2018  P1, 81 hour female infant. LC entered room Mom and infant asleep.    Maternal Data    Feeding Feeding Type: Breast Fed  LATCH Score                   Interventions    Lactation Tools Discussed/Used     Consult Status      Vicente Serene 09/13/2018, 6:07 AM

## 2018-09-13 NOTE — Clinical Social Work Maternal (Signed)
CLINICAL SOCIAL WORK MATERNAL/CHILD NOTE  Patient Details  Name: Kimberly Kidd MRN: [id number] Date of Birth: 02/18/1995  Date:  May 24, 2018  Clinical Social Worker Initiating Note:  Elijio Miles Date/Time: Initiated:  09/13/18/0858     Child's Name:  Kimberly Kidd   Biological Parents:  Mother, Father(Kimberly Kidd and Kimberly Kidd (FOB is deceased))   Need for Interpreter:  None   Reason for Referral:  Behavioral Health Concerns, Grief and Loss (History of bipolar, anxiety, PTSD; FOB died from accidental overdose during pregnancy)   Address:  [address] [place] [address]    Phone number:  [phone] (home)     Additional phone number:   Household Members/Support Persons (HM/SP):   Household Member/Support Person 1   HM/SP Name Relationship DOB or Age  HM/SP -1 Kimberly Kidd Grandmother    HM/SP -2        HM/SP -3        HM/SP -4        HM/SP -5        HM/SP -6        HM/SP -7        HM/SP -8          Natural Supports (not living in the home):  Parent, Friends   Chiropodist: None   Employment: Unemployed   Type of Work:     Education:  Other (comment)(GED)   Homebound arranged:    Museum/gallery curator Resources:  Medicaid   Other Resources:  WIC(Intends to apply for food stamps)   Cultural/Religious Considerations Which May Impact Care:    Strengths:  Ability to meet basic needs , Home prepared for child , Pediatrician chosen   Psychotropic Medications:         Pediatrician:    [place]  Pediatrician List:   [facility]      Pediatrician Fax Number:    Risk Factors/Current Problems:  Mental Health Concerns (MOB has history of depression but is currently taking Zoloft)   Cognitive State:  Able to Concentrate , Alert , Linear Thinking    Mood/Affect:  Bright , Comfortable , Interested ,  Relaxed , Happy    CSW Assessment:  CSW received consult for history of bipolar, anxiety, PTSD, suicide attempts and FOB passed away during pregnancy from accidental overdose.  CSW met with MOB to offer support and complete assessment.    MOB up walking around the room with infant asleep at bedside and MGM present at bedside, when CSW entered the room. CSW introduced self and received verbal permission from MOB to have MGM step out so CSW could complete assessment. MGM understanding and left voluntarily. MOB pleasant and easy to engage throughout assessment. CSW explained reason for consult to which MOB expressed understanding. MOB reported she currently lives with her grandmother in [place]. MOB stated she is not currently employed but confirmed she receives [facility] and intends to apply for food stamps. CSW inquired about MOB's mental health history and MOB acknowledged having a history of depression starting when she was in middle school. MOB denied any recent symptoms and reported last time she experienced a bout of depression was a couple of years ago. MOB reported her depression has "gotten a lot better". MOB inquired about history of bipolar disorder and PTSD to which MOB was not aware of. MOB reported  she currently takes Zoloft daily and feels it is effective in managing her symptoms. MOB reported she has a history of participating in counseling but nothing recent and stated she does not feel it is something she needs right now. CSW provided education regarding the baby blues period vs. perinatal mood disorders, discussed treatment and gave resources for mental health follow up if concerns arise.  CSW recommends self-evaluation during the postpartum time period using the New Mom Checklist from Postpartum Progress and encouraged MOB to contact a medical professional if symptoms are noted at any time. MOB denied any current SI, HI or DV and reported having a good support system consisting of her  grandmother, her mother and a few friends. CSW inquired further about MOB's history of suicide attempts and MOB denied anything recent. MOB did not bring up passing of FOB during pregnancy but did acknowledge it when CSW brought it up. MOB reported she feels like she has been able to cope with it the best she can and stated she has a really good support system helping her through it. CSW offered to provide MOB with resource list for counseling in the event symptoms may arise and MOB was receptive. MOB did not appear to be displaying any acute mental health symptoms and reported feeling "pretty good". CSW also addressed MOB's history of substance use and MOB acknowledged use "over a year ago" and reported she had tried it with FOB a couple of times but denied anything during pregnancy and does not feel it is a current problem for her.   MOB confirmed having all essential items for infant once discharged and reported infant would be sleeping in a crib once home. CSW provided review of Sudden Infant Death Syndrome (SIDS) precautions and safe sleeping habits. CSW offered to refer MOB to programs that could offer additional support once discharged such as [facility] or Healthy Start but MOB declined at this time. MOB denied any further questions, concerns or need for resources from CSW.   CSW Plan/Description:  No Further Intervention Required/No Barriers to Discharge, Sudden Infant Death Syndrome (SIDS) Education, Perinatal Mood and Anxiety Disorder (PMADs) Education    Kimberly Kidd, LCSWA 09/13/2018, 9:25 AM 

## 2018-09-16 ENCOUNTER — Other Ambulatory Visit (HOSPITAL_COMMUNITY)
Admission: RE | Admit: 2018-09-16 | Discharge: 2018-09-16 | Disposition: A | Discharge status: Home / Self Care | Payer: Medicaid Other | Source: Ambulatory Visit

## 2018-09-16 ENCOUNTER — Other Ambulatory Visit: Payer: Medicaid Other | Admitting: Obstetrics & Gynecology

## 2018-09-18 ENCOUNTER — Inpatient Hospital Stay (HOSPITAL_COMMUNITY): Admission: AD | Admit: 2018-09-18 | Payer: Medicaid Other | Source: Home / Self Care

## 2018-09-18 ENCOUNTER — Inpatient Hospital Stay (HOSPITAL_COMMUNITY): Payer: Medicaid Other

## 2018-10-17 ENCOUNTER — Telehealth: Payer: Medicaid Other | Admitting: Women's Health

## 2018-11-04 ENCOUNTER — Telehealth: Payer: Medicaid Other | Admitting: Women's Health

## 2018-11-10 ENCOUNTER — Encounter: Payer: Self-pay | Admitting: *Deleted

## 2018-11-11 ENCOUNTER — Encounter: Payer: Self-pay | Admitting: Women's Health

## 2018-11-11 ENCOUNTER — Other Ambulatory Visit: Payer: Self-pay

## 2018-11-11 ENCOUNTER — Telehealth (INDEPENDENT_AMBULATORY_CARE_PROVIDER_SITE_OTHER): Payer: Medicaid Other | Admitting: Women's Health

## 2018-11-11 DIAGNOSIS — Z30011 Encounter for initial prescription of contraceptive pills: Secondary | ICD-10-CM

## 2018-11-11 MED ORDER — LO LOESTRIN FE 1 MG-10 MCG / 10 MCG PO TABS: 1.0000 | ORAL_TABLET | Freq: Every day | ORAL | 3 refills | Status: DC

## 2018-11-11 NOTE — Progress Notes (Signed)
TELEHEALTH VIRTUAL POSTPARTUM VISIT ENCOUNTER NOTE Patient name: Kimberly Kidd MRN [id number]  Date of birth: July 14, 1995  I connected with patient on 11/11/18 at  2:10 PM EDT by phone (unable to get video to work) and verified that I am speaking with the correct person using two identifiers. Due to COVID-19 recommendations, pt is not currently in our office.    I discussed the limitations, risks, security and privacy concerns of performing an evaluation and management service by telephone and the availability of in person appointments. I also discussed with the patient that there may be a patient responsible charge related to this service. The patient expressed understanding and agreed to proceed.  Chief Complaint:   Postpartum Care  History of Present Illness:   Kimberly Kidd is a 23 y.o. G62P1001 Caucasian female being evaluated today for a postpartum visit. She is 8 weeks postpartum following a spontaneous vaginal delivery at 40.3 gestational weeks. Anesthesia: epidural. Laceration: 2nd degree. I have fully reviewed the prenatal and intrapartum course. Pregnancy uncomplicated. Postpartum course has been uncomplicated. Bleeding no bleeding. Bowel function is normal. Bladder function is normal.  Patient is not sexually active. Last sexual activity: prior to birth of baby.  Contraception method is wants COCs. Does not smoke, no h/o HTN, DVT/PE, CVA, MI, or migraines w/ aura.  Last pap 04/11/18.  Results were abnormal  ASCUS w/ +HRHPV .  Patient's last menstrual period was 10/30/2018.  Baby's course has been uncomplicated. Baby is feeding by bottle   Edinburgh Postpartum Depression Screening: negative. Does have h/o dep/anx, reports she did have some postpartum depression, restarted her zoloft, actually made it worse, so stopped zoloft, and depression has much improved over last few weeks. Denies SI/HI/II Edinburgh Postnatal Depression Scale - 11/11/18 1406      Edinburgh Postnatal  Depression Scale:  In the Past 7 Days   I have been able to laugh and see the funny side of things.  0    I have looked forward with enjoyment to things.  1    I have blamed myself unnecessarily when things went wrong.  1    I have been anxious or worried for no good reason.  1    I have felt scared or panicky for no good reason.  0    Things have been getting on top of me.  1    I have been so unhappy that I have had difficulty sleeping.  1    I have felt sad or miserable.  1    I have been so unhappy that I have been crying.  1    The thought of harming myself has occurred to me.  0    Edinburgh Postnatal Depression Scale Total  7      Review of Systems:   Pertinent items are noted in HPI Denies Abnormal vaginal discharge w/ itching/odor/irritation, headaches, visual changes, shortness of breath, chest pain, abdominal pain, severe nausea/vomiting, or problems with urination or bowel movements. Pertinent History Reviewed:  Reviewed past medical,surgical, obstetrical and family history.  Reviewed problem list, medications and allergies. OB History  Gravida Para Term Preterm AB Living  1 1 1     1   SAB TAB Ectopic Multiple Live Births        0 1    # Outcome Date GA Lbr Len/2nd Weight Sex Delivery Anes PTL Lv  1 Term 09/12/18 [redacted]w[redacted]d 26:54 / 00:21 6 lb 14.8 oz (3.14 kg) F Vag-Spont EPI  LIV  Physical Assessment:  There were no vitals filed for this visit.There is no height or weight on file to calculate BMI.       Physical Examination:  General:  Alert, oriented and cooperative.   Mental Status: Normal mood and affect perceived. Normal judgment and thought content.  Rest of physical exam deferred due to type of encounter       No results found for this or any previous visit (from the past 24 hour(s)).  Assessment & Plan:  1) Postpartum exam 2) 8 wks s/p SVB 3) Bottlefeeding 4) Depression screening 5) Contraception counseling, pt prefers OCP (estrogen/progesterone), rx  LoLoestrin, f/u 36mths 6) H/O abnormal pap  Meds:  Meds ordered this encounter  Medications  . LO LOESTRIN FE 1 MG-10 MCG / 10 MCG tablet    Sig: Take 1 tablet by mouth daily.    Dispense:  3 Package    Refill:  3    For co-pay card, pt to text "Lo Loestrin Fe " to [phone]              Co-pay card must be run in second position  "other coverage code 3"  if denied d/t PA, step edit, or insurance denial    Order Specific Question:   Supervising Provider    Answer:   Tania Ade H [[id number]]    I discussed the assessment and treatment plan with the patient. The patient was provided an opportunity to ask questions and all were answered. The patient agreed with the plan and demonstrated an understanding of the instructions.   The patient was advised to call back or seek an in-person evaluation/go to the ED for any concerning postpartum symptoms.  I provided 15 minutes of non-face-to-face time during this encounter.  Follow-up: Return in about 3 months (around 02/11/2019) for F/U meds, then after 3/12 for , Pap & physical.   No orders of the defined types were placed in this encounter.   [place], [facility] 11/11/2018 2:41 PM

## 2019-02-11 ENCOUNTER — Telehealth: Payer: Medicaid Other | Admitting: Advanced Practice Midwife

## 2019-02-17 ENCOUNTER — Encounter: Payer: Self-pay | Admitting: Advanced Practice Midwife

## 2019-02-17 ENCOUNTER — Telehealth (INDEPENDENT_AMBULATORY_CARE_PROVIDER_SITE_OTHER): Payer: Medicaid Other | Admitting: Advanced Practice Midwife

## 2019-02-17 ENCOUNTER — Other Ambulatory Visit: Payer: Self-pay

## 2019-02-17 DIAGNOSIS — Z3041 Encounter for surveillance of contraceptive pills: Secondary | ICD-10-CM | POA: Insufficient documentation

## 2019-02-17 MED ORDER — NORETHIN-ETH ESTRAD-FE BIPHAS 1 MG-10 MCG / 10 MCG PO TABS: 1.0000 | ORAL_TABLET | Freq: Every day | ORAL | 11 refills | Status: DC

## 2019-02-17 MED ORDER — LO LOESTRIN FE 1 MG-10 MCG / 10 MCG PO TABS: 1.0000 | ORAL_TABLET | Freq: Every day | ORAL | 3 refills | Status: DC

## 2019-02-17 NOTE — Progress Notes (Signed)
TELEHEALTH VIRTUAL GYN VISIT ENCOUNTER NOTE Patient name: Kimberly Kidd MRN [id number]  Date of birth: December 27, 1995  I connected with patient on 02/17/19 at 11:30 AM EST by telephone (she was having connection issues with MyChart) and verified that I am speaking with the correct person using two identifiers.  Due to COVID-19 recommendations, pt is not currently in the office.    I discussed the limitations, risks, security and privacy concerns of performing an evaluation and management service by telephone and the availability of in person appointments. I also discussed with the patient that there may be a patient responsible charge related to this service. The patient expressed understanding and agreed to proceed.   Chief Complaint:   Follow-up (on loloestrin)  History of Present Illness:   Kimberly Kidd is a 24 y.o. G64P1001 Caucasian female being evaluated today for f/u on Lo Loestrin (started in Oct 2020). Doing well; has had some cramping during placebo week but no bleeding. Would like to continue.   No LMP recorded. The current method of family planning is OCP (estrogen/progesterone).  Last pap 03/2018. Results were:  abnormal  ASCUS w/ pos HPV Review of Systems:   Pertinent items are noted in HPI Denies fever/chills, dizziness, headaches, visual disturbances, fatigue, shortness of breath, chest pain, abdominal pain, vomiting, abnormal vaginal discharge/itching/odor/irritation, problems with periods, bowel movements, urination, or intercourse unless otherwise stated above.  Pertinent History Reviewed:  Reviewed past medical,surgical, social, obstetrical and family history.  Reviewed problem list, medications and allergies. Physical Assessment:  There were no vitals filed for this visit.There is no height or weight on file to calculate BMI.       Physical Examination:   General:  Alert, oriented and cooperative.   Mental Status: Normal mood and affect perceived. Normal judgment  and thought content.  Physical exam deferred due to nature of the encounter  No results found for this or any previous visit (from the past 24 hour(s)).  Assessment & Plan:  1) Contraceptive management> doing well on Lo Loestrin  2) Abnormal pap> had ASCUS w/ pos HPV in March 2020; will f/u in March 2021 for repeat  Meds:  Meds ordered this encounter  Medications  . Norethindrone-Ethinyl Estradiol-Fe Biphas (LO LOESTRIN FE) 1 MG-10 MCG / 10 MCG tablet    Sig: Take 1 tablet by mouth daily.    Dispense:  1 Package    Refill:  11  . LO LOESTRIN FE 1 MG-10 MCG / 10 MCG tablet    Sig: Take 1 tablet by mouth daily.    Dispense:  3 Package    Refill:  3    For co-pay card, pt to text "Lo Loestrin Fe " to [phone]              Co-pay card must be run in second position  "other coverage code 3"  if denied d/t PA, step edit, or insurance denial    No orders of the defined types were placed in this encounter.   I discussed the assessment and treatment plan with the patient. The patient was provided an opportunity to ask questions and all were answered. The patient agreed with the plan and demonstrated an understanding of the instructions.   The patient was advised to call back or seek an in-person evaluation/go to the ED if the symptoms worsen or if the condition fails to improve as anticipated.  I provided 6 minutes of non-face-to-face time during this encounter.   No follow-ups on file.  Myrtis Ser CNM 02/17/2019 11:43 AM

## 2019-04-16 ENCOUNTER — Other Ambulatory Visit: Payer: Medicaid Other | Admitting: Advanced Practice Midwife

## 2019-05-14 ENCOUNTER — Other Ambulatory Visit (HOSPITAL_COMMUNITY)
Admission: RE | Admit: 2019-05-14 | Discharge: 2019-05-14 | Disposition: A | Discharge status: Home / Self Care | Payer: Medicaid Other | Source: Ambulatory Visit | Attending: Advanced Practice Midwife | Admitting: Advanced Practice Midwife

## 2019-05-14 ENCOUNTER — Ambulatory Visit (INDEPENDENT_AMBULATORY_CARE_PROVIDER_SITE_OTHER): Payer: Medicaid Other | Admitting: Advanced Practice Midwife

## 2019-05-14 ENCOUNTER — Encounter: Payer: Self-pay | Admitting: Advanced Practice Midwife

## 2019-05-14 ENCOUNTER — Other Ambulatory Visit: Payer: Self-pay

## 2019-05-14 VITALS — BP 119/75 | HR 73 | Ht 63.0 in | Wt 138.0 lb

## 2019-05-14 DIAGNOSIS — Z01419 Encounter for gynecological examination (general) (routine) without abnormal findings: Secondary | ICD-10-CM

## 2019-05-14 DIAGNOSIS — Z0000 Encounter for general adult medical examination without abnormal findings: Secondary | ICD-10-CM

## 2019-05-14 MED ORDER — LO LOESTRIN FE 1 MG-10 MCG / 10 MCG PO TABS: 1.0000 | ORAL_TABLET | Freq: Every day | ORAL | 3 refills | Status: DC

## 2019-05-14 MED ORDER — NORETHIN-ETH ESTRAD-FE BIPHAS 1 MG-10 MCG / 10 MCG PO TABS: 1.0000 | ORAL_TABLET | Freq: Every day | ORAL | 11 refills | Status: DC

## 2019-05-14 NOTE — Progress Notes (Signed)
WELL-WOMAN EXAMINATION Patient name: Kimberly Kidd MRN [id number]  Date of birth: Feb 08, 1995 Chief Complaint:   Gynecologic Exam  History of Present Illness:   Kimberly Kidd is a 24 y.o. G36P1001 Caucasian female being seen today for a routine well-woman exam.  Current complaints: none; doing well on LoLoestrin- cycles are about 5-6d in length with some spotting at the end; baby just turned 8 mos and is doing well!  Depression screen [facility] 2/9 05/14/2019 04/11/2018 03/19/2018  Decreased Interest 1 0 0  Down, Depressed, Hopeless 1 0 0  PHQ - 2 Score 2 0 0  Altered sleeping 1 0 -  Tired, decreased energy 0 0 -  Change in appetite 0 0 -  Feeling bad or failure about yourself  0 0 -  Trouble concentrating 0 1 -  Moving slowly or fidgety/restless 0 0 -  Suicidal thoughts 0 0 -  PHQ-9 Score 3 1 -     PCP: none currently      does not desire labs Patient's last menstrual period was 04/20/2019. The current method of family planning is LoLoestrin, but not currently sexually active (has had sex x 1 since baby was born).  Last pap March 2020. Results were: ASCUS with +HPV. H/O abnormal pap: Yes Last mammogram: never. No fam hx. Last colonoscopy: never. No fam hx. Review of Systems:   Pertinent items are noted in HPI Denies any headaches, blurred vision, fatigue, shortness of breath, chest pain, abdominal pain, abnormal vaginal discharge/itching/odor/irritation, problems with periods, bowel movements, urination, or intercourse unless otherwise stated above. Pertinent History Reviewed:  Reviewed past medical,surgical, social and family history.  Reviewed problem list, medications and allergies. Physical Assessment:   Vitals:   05/14/19 1341  BP: 119/75  Pulse: 73  Weight: 138 lb (62.6 kg)  Height: 5\' 3"  (1.6 m)  Body mass index is 24.45 kg/m.        Physical Examination:   General appearance - well appearing, and in no distress  Mental status - alert, oriented to person,  place, and time  Psych:  She has a normal mood and affect  Skin - warm and dry, normal color, no suspicious lesions noted  Chest - effort normal, all lung fields clear to auscultation bilaterally  Heart - normal rate and regular rhythm  Neck:  midline trachea, no thyromegaly or nodules  Breasts - breasts appear normal, no suspicious masses, no skin or nipple changes or  axillary nodes  Abdomen - soft, nontender, nondistended, no masses or organomegaly  Pelvic - VULVA: normal appearing vulva with no masses, tenderness or lesions  VAGINA: normal appearing vagina with normal color and discharge, no lesions  CERVIX: very erythematous appearing cervix without discharge or lesions, no CMT  Thin prep pap is done without HR HPV cotesting  UTERUS: uterus is felt to be normal size, shape, consistency and nontender   ADNEXA: No adnexal masses or tenderness noted.  Extremities:  No swelling or varicosities noted  Chaperone: Latisha Cresenzo    No results found for this or any previous visit (from the past 24 hour(s)).  Assessment & Plan:  1) Well-Woman Exam  2) Hx Pap with ASCUS  Labs/procedures today: Pap (declines GC/chlam)  Mammogram age 32 or sooner if problems Colonoscopy age 44 or sooner if problems  No orders of the defined types were placed in this encounter.   Meds:  Meds ordered this encounter  Medications  . Norethindrone-Ethinyl Estradiol-Fe Biphas (LO LOESTRIN FE) 1 MG-10 MCG / 10 MCG  tablet    Sig: Take 1 tablet by mouth daily.    Dispense:  1 Package    Refill:  11    Order Specific Question:   Supervising Provider    Answer:   Tilda Burrow [[id number]]  . LO LOESTRIN FE 1 MG-10 MCG / 10 MCG tablet    Sig: Take 1 tablet by mouth daily.    Dispense:  3 Package    Refill:  3    For co-pay card, pt to text "Lo Loestrin Fe " to [phone]              Co-pay card must be run in second position  "other coverage code 3"  if denied d/t PA, step edit, or insurance denial    Order  Specific Question:   Supervising Provider    Answer:   Tilda Burrow [[id number]]    Follow-up: Return in about 1 year (around 05/13/2020) for Physical.  Kimberly Kidd CNM 05/14/2019 2:00 PM

## 2019-05-16 LAB — CYTOLOGY - PAP
Chlamydia: NEGATIVE
Comment: NEGATIVE
Comment: NORMAL
Diagnosis: NEGATIVE
Neisseria Gonorrhea: NEGATIVE

## 2019-10-15 ENCOUNTER — Other Ambulatory Visit: Payer: Self-pay

## 2019-10-15 ENCOUNTER — Other Ambulatory Visit: Payer: Medicaid Other

## 2019-10-15 DIAGNOSIS — Z20822 Contact with and (suspected) exposure to covid-19: Secondary | ICD-10-CM

## 2019-10-17 LAB — NOVEL CORONAVIRUS, NAA: SARS-CoV-2, NAA: NOT DETECTED

## 2019-10-17 LAB — SARS-COV-2, NAA 2 DAY TAT

## 2020-12-30 DIAGNOSIS — Z419 Encounter for procedure for purposes other than remedying health state, unspecified: Secondary | ICD-10-CM | POA: Diagnosis not present

## 2021-01-30 DIAGNOSIS — Z419 Encounter for procedure for purposes other than remedying health state, unspecified: Secondary | ICD-10-CM | POA: Diagnosis not present

## 2021-01-30 NOTE — L&D Delivery Note (Signed)
Delivery Note Kimberly Kidd is a 26 y.o. female [phone] with IUP at [redacted]w[redacted]d admitted for spontaneous onset of labor..  At 1:56 PM a viable female was delivered via Vaginal, Spontaneous (Presentation:   Occiput Anterior).  APGAR: 8, 9; weight  TBD. Nuchal cord X1 noted. Baby delivered through the cord. Placenta status: Spontaneous, Intact.  Cord: 3 vessels with the following complications: Knot (loose true knot X2).  Cord pH: not collected  Anesthesia: Epidural Episiotomy: None Lacerations: 1st degree;Perineal Suture Repair: 3.0 vicryl Est. Blood Loss (mL): 236  Mom to postpartum.  Baby to Couplet care / Skin to Skin.  [name] [name] 10/29/2021, 2:39 PM

## 2021-03-01 ENCOUNTER — Other Ambulatory Visit: Payer: Medicaid Other

## 2021-03-01 ENCOUNTER — Other Ambulatory Visit: Payer: Self-pay

## 2021-03-01 ENCOUNTER — Ambulatory Visit (INDEPENDENT_AMBULATORY_CARE_PROVIDER_SITE_OTHER): Payer: Medicaid Other

## 2021-03-01 VITALS — BP 113/65 | HR 103 | Ht 63.0 in | Wt 167.6 lb

## 2021-03-01 DIAGNOSIS — Z3200 Encounter for pregnancy test, result unknown: Secondary | ICD-10-CM

## 2021-03-01 LAB — POCT URINE PREGNANCY: Preg Test, Ur: POSITIVE — AB

## 2021-03-01 NOTE — Progress Notes (Addendum)
° °  NURSE VISIT- PREGNANCY CONFIRMATION   SUBJECTIVE:  Kimberly Kidd is a 26 y.o. G2P1001 female at [redacted]w[redacted]d by uncertain LMP of Patient's last menstrual period was 12/29/2020 (approximate). Here for pregnancy confirmation.  Home pregnancy test: positive x 1   She reports cramping.  She is not taking prenatal vitamins.    OBJECTIVE:  BP 113/65 (BP Location: Left Arm, Patient Position: Sitting, Cuff Size: Normal)    Pulse (!) 103    Ht 5\' 3"  (1.6 m)    Wt 167 lb 9.6 oz (76 kg)    LMP 12/29/2020 (Approximate)    BMI 29.69 kg/m   Appears well, in no apparent distress  Results for orders placed or performed in visit on 03/01/21 (from the past 24 hour(s))  POCT urine pregnancy   Collection Time: 03/01/21  3:10 PM  Result Value Ref Range   Preg Test, Ur Positive (A) Negative    ASSESSMENT: Positive pregnancy test, [redacted]w[redacted]d by LMP    PLAN: Quant HCG to confirm GA Prenatal vitamins: plans to begin OTC ASAP   Nausea medicines: not currently needed   OB packet given: Yes  Sayre Witherington A Priyal Musquiz  03/01/2021 3:16 PM   Chart reviewed for nurse visit. Agree with plan of care.  03/03/2021, Cheral Marker 03/01/2021 3:39 PM

## 2021-03-02 ENCOUNTER — Emergency Department (HOSPITAL_COMMUNITY): Payer: Medicaid Other

## 2021-03-02 ENCOUNTER — Other Ambulatory Visit: Payer: Self-pay | Admitting: Women's Health

## 2021-03-02 ENCOUNTER — Emergency Department (HOSPITAL_COMMUNITY)
Admission: EM | Admit: 2021-03-02 | Discharge: 2021-03-02 | Disposition: A | Discharge status: Home / Self Care | Payer: Medicaid Other | Attending: Emergency Medicine | Admitting: Emergency Medicine

## 2021-03-02 ENCOUNTER — Encounter (HOSPITAL_COMMUNITY): Payer: Self-pay

## 2021-03-02 DIAGNOSIS — O26891 Other specified pregnancy related conditions, first trimester: Secondary | ICD-10-CM | POA: Insufficient documentation

## 2021-03-02 DIAGNOSIS — Z419 Encounter for procedure for purposes other than remedying health state, unspecified: Secondary | ICD-10-CM | POA: Diagnosis not present

## 2021-03-02 DIAGNOSIS — N838 Other noninflammatory disorders of ovary, fallopian tube and broad ligament: Secondary | ICD-10-CM | POA: Diagnosis not present

## 2021-03-02 DIAGNOSIS — Z3A09 9 weeks gestation of pregnancy: Secondary | ICD-10-CM | POA: Diagnosis not present

## 2021-03-02 DIAGNOSIS — R109 Unspecified abdominal pain: Secondary | ICD-10-CM | POA: Diagnosis not present

## 2021-03-02 DIAGNOSIS — R102 Pelvic and perineal pain: Secondary | ICD-10-CM | POA: Insufficient documentation

## 2021-03-02 DIAGNOSIS — O3680X0 Pregnancy with inconclusive fetal viability, not applicable or unspecified: Secondary | ICD-10-CM

## 2021-03-02 DIAGNOSIS — J45909 Unspecified asthma, uncomplicated: Secondary | ICD-10-CM | POA: Insufficient documentation

## 2021-03-02 DIAGNOSIS — R1030 Lower abdominal pain, unspecified: Secondary | ICD-10-CM

## 2021-03-02 LAB — CBC WITH DIFFERENTIAL/PLATELET
Abs Immature Granulocytes: 0.03 10*3/uL (ref 0.00–0.07)
Basophils Absolute: 0 10*3/uL (ref 0.0–0.1)
Basophils Relative: 1 %
Eosinophils Absolute: 0.2 10*3/uL (ref 0.0–0.5)
Eosinophils Relative: 3 %
HCT: 41 % (ref 36.0–46.0)
Hemoglobin: 13.8 g/dL (ref 12.0–15.0)
Immature Granulocytes: 1 %
Lymphocytes Relative: 31 %
Lymphs Abs: 1.8 10*3/uL (ref 0.7–4.0)
MCH: 30.7 pg (ref 26.0–34.0)
MCHC: 33.7 g/dL (ref 30.0–36.0)
MCV: 91.3 fL (ref 80.0–100.0)
Monocytes Absolute: 0.5 10*3/uL (ref 0.1–1.0)
Monocytes Relative: 8 %
Neutro Abs: 3.3 10*3/uL (ref 1.7–7.7)
Neutrophils Relative %: 56 %
Platelets: 274 10*3/uL (ref 150–400)
RBC: 4.49 MIL/uL (ref 3.87–5.11)
RDW: 11.9 % (ref 11.5–15.5)
WBC: 5.7 10*3/uL (ref 4.0–10.5)
nRBC: 0 % (ref 0.0–0.2)

## 2021-03-02 LAB — COMPREHENSIVE METABOLIC PANEL
ALT: 13 U/L (ref 0–44)
AST: 16 U/L (ref 15–41)
Albumin: 3.8 g/dL (ref 3.5–5.0)
Alkaline Phosphatase: 47 U/L (ref 38–126)
Anion gap: 5 (ref 5–15)
BUN: 11 mg/dL (ref 6–20)
CO2: 22 mmol/L (ref 22–32)
Calcium: 8.5 mg/dL — ABNORMAL LOW (ref 8.9–10.3)
Chloride: 108 mmol/L (ref 98–111)
Creatinine, Ser: 0.63 mg/dL (ref 0.44–1.00)
GFR, Estimated: >60 mL/min (ref >60)
Glucose, Bld: 72 mg/dL (ref 70–99)
Potassium: 3.5 mmol/L (ref 3.5–5.1)
Sodium: 135 mmol/L (ref 135–145)
Total Bilirubin: 0.5 mg/dL (ref 0.3–1.2)
Total Protein: 6.7 g/dL (ref 6.5–8.1)

## 2021-03-02 LAB — URINALYSIS, ROUTINE W REFLEX MICROSCOPIC
Bilirubin Urine: NEGATIVE
Glucose, UA: NEGATIVE mg/dL
Hgb urine dipstick: NEGATIVE
Ketones, ur: NEGATIVE mg/dL
Leukocytes,Ua: NEGATIVE
Nitrite: NEGATIVE
Protein, ur: NEGATIVE mg/dL
Specific Gravity, Urine: 1.01 (ref 1.005–1.030)
pH: 5.5 (ref 5.0–8.0)

## 2021-03-02 LAB — LIPASE, BLOOD: Lipase: 30 U/L (ref 11–51)

## 2021-03-02 LAB — ABO/RH: ABO/RH(D): A POS

## 2021-03-02 LAB — HCG, QUANTITATIVE, PREGNANCY: hCG, Beta Chain, Quant, S: 3449 m[IU]/mL — ABNORMAL HIGH (ref <5)

## 2021-03-02 LAB — MAGNESIUM: Magnesium: 1.9 mg/dL (ref 1.7–2.4)

## 2021-03-02 LAB — BETA HCG QUANT (REF LAB): hCG Quant: 2013 m[IU]/mL

## 2021-03-02 MED ORDER — ACETAMINOPHEN 325 MG PO TABS
650.0000 mg | ORAL_TABLET | Freq: Once | ORAL | Status: AC
  Administered 2021-03-02: 650 mg via ORAL
  Filled 2021-03-02: qty 2

## 2021-03-02 NOTE — ED Provider Notes (Signed)
[facility] EMERGENCY DEPARTMENT Provider Note   CSN: [id number] Arrival date & time: 03/02/21  5284     History  Chief Complaint  Patient presents with   Abdominal Pain    Kimberly Kidd is a 26 y.o. female.   Abdominal Pain Patient is a G2, P84 female who presents for lower abdominal pain over the past week and a half.  Pain radiates to her lower back.  Pain was severe at time of onset.  Since that time, it has minimized.  Pain again worsened since yesterday.  Medical history is notable for depression, anxiety, history of eating disorder, PTSD.  Per chart review, [facility] where pregnancy was confirmed.  LMP was 11/30 suggesting a gestational age of [redacted] weeks.  hCG yesterday was 2013.  Patient denies any dysuria but he states that she has had some vaginal pain.  She denies any discharge or bleeding.  She denies any complications with her prior pregnancy.  She has not taken anything for analgesia.  She denies any recent fevers or chills.  She has not had nausea.  She has had good p.o. intake.    Home Medications Prior to Admission medications   Medication Sig Start Date End Date Taking? Authorizing Provider  acetaminophen (TYLENOL) 500 MG tablet Take 500 mg by mouth every 6 (six) hours as needed for mild pain or fever.   Yes [provider]      Allergies    Patient has no known allergies.    Review of Systems   Review of Systems  Gastrointestinal:  Positive for abdominal pain.  Musculoskeletal:  Positive for back pain.  All other systems reviewed and are negative.  Physical Exam Updated Vital Signs BP (!) 110/57    Pulse 78    Temp 98 F (36.7 C)    Resp 16    Ht 5\' 3"  (1.6 m)    Wt 75.8 kg    LMP 12/29/2020 (Approximate)    SpO2 100%    BMI 29.58 kg/m  Physical Exam Vitals and nursing note reviewed.  Constitutional:      General: She is not in acute distress.    Appearance: She is well-developed and normal weight. She is not  ill-appearing, toxic-appearing or diaphoretic.  HENT:     Head: Normocephalic and atraumatic.     Mouth/Throat:     Mouth: Mucous membranes are moist.     Pharynx: Oropharynx is clear.  Eyes:     General: No scleral icterus.    Conjunctiva/sclera: Conjunctivae normal.  Cardiovascular:     Rate and Rhythm: Normal rate and regular rhythm.     Heart sounds: No murmur heard. Pulmonary:     Effort: Pulmonary effort is normal. No respiratory distress.     Breath sounds: Normal breath sounds.  Abdominal:     Palpations: Abdomen is soft.     Tenderness: There is abdominal tenderness in the suprapubic area. There is right CVA tenderness. There is no left CVA tenderness, guarding or rebound.  Musculoskeletal:        General: No swelling.     Cervical back: Neck supple.  Skin:    General: Skin is warm and dry.     Capillary Refill: Capillary refill takes less than 2 seconds.  Neurological:     General: No focal deficit present.     Mental Status: She is alert and oriented to person, place, and time.     Cranial Nerves: No  cranial nerve deficit.     Motor: No weakness.  Psychiatric:        Mood and Affect: Mood normal.        Behavior: Behavior normal.    ED Results / Procedures / Treatments   Labs (all labs ordered are listed, but only abnormal results are displayed) Labs Reviewed  COMPREHENSIVE METABOLIC PANEL - Abnormal; Notable for the following components:      Result Value   Calcium 8.5 (*)    All other components within normal limits  HCG, QUANTITATIVE, PREGNANCY - Abnormal; Notable for the following components:   hCG, Beta Chain, Quant, S 3,449 (*)    All other components within normal limits  LIPASE, BLOOD  CBC WITH DIFFERENTIAL/PLATELET  URINALYSIS, ROUTINE W REFLEX MICROSCOPIC  MAGNESIUM  ABO/RH    EKG None  Radiology US OB LESS THAN 14 WEEKS WITH OB TRANSVAGINAL  Result Date: 03/02/2021 CLINICAL DATA:  27 year old female with cramping in the 1st trimester of  pregnancy. Quantitative beta hCG 3,449. Estimated gestational age by LMP 9 weeks and 0 days. EXAM: OBSTETRIC <14 WK [place] AND TRANSVAGINAL OB US TECHNIQUE: Both transabdominal and transvaginal ultrasound examinations were performed for complete evaluation of the gestation as well as the maternal uterus, adnexal regions, and pelvic cul-de-sac. Transvaginal technique was performed to assess early pregnancy. COMPARISON:  Pelvis CT 02/05/2003. FINDINGS: Intrauterine gestational sac: None Maternal uterus/adnexae: The right ovary appears normal measuring 3.8 x 1.8 by 2.2 cm (8 mL). Left ovary is larger measuring 3.6 x 3.1 by 3.3 cm and contains a simple appearing cyst with surrounding hypervascularity (image 43) which is favored to be the corpus luteum. No pelvic free fluid. Homogeneous appearing endometrial thickening (image 60). IMPRESSION: No IUP, suspicious adnexal finding, or pelvic free fluid. Left ovarian corpus luteum cyst suspected. Differential considerations include failed IUP, early IUP, and occult ectopic pregnancy. Recommend serial quantitative beta HCG, and repeat ultrasound as necessary. Electronically Signed   By: Genevie Ann M.D.   On: 03/02/2021 11:09    Procedures Procedures    Medications Ordered in ED Medications  acetaminophen (TYLENOL) tablet 650 mg (650 mg Oral Given 03/02/21 [id number])    ED Course/ Medical Decision Making/ A&P                           Medical Decision Making Amount and/or Complexity of Data Reviewed Labs: ordered. Radiology: ordered.  Risk OTC drugs.   This patient presents to the ED for concern of lower abdominal pain, this involves an extensive number of treatment options, and is a complaint that carries with it a high risk of complications and morbidity.  The differential diagnosis includes cystitis, pyelonephritis, PID, ectopic pregnancy, constipation, colitis  MDM:    26 year old G78 P25 female, currently approximately [redacted] weeks pregnant, presenting for lower  abdominal pain that radiates to her lower back.  Symptom onset was a week and a half ago.  At that time it was severe but has subsided since.  Severity of pain recurred since yesterday.  She is not pregnant by LMP.  hCG yesterday was 2000.  On arrival in the ED, patient is normotensive and afebrile.  She is well-appearing on exam.  She does have tenderness in the suprapubic region of her lower abdomen.  Pain or tenderness is not worse on one side and appears to be midline.  She does have CVA tenderness as well.  Tylenol was given for analgesia.  Patient to undergo  laboratory work-up, to include repeat hCG.  If hCG does appear to be above the discriminatory zone, ultrasound would be indicated to identify location of pregnancy.  Patient's hCG was in the range of 3500.  This is close to discriminatory zone and for semester ultrasound was ordered.  Remaining lab work is reassuring.  Urinalysis showed no evidence of infection.  Ultrasound was not able to identify an IUP.  There were no suspicious adnexal findings either.  On reassessment, patient reports improved pain.  Given absence of bleeding or discharge, pelvic exam was deferred.  Patient was advised to follow-up with her OB doctor in 2 days for hCG quant.  She does state that she will be able to call them upon discharge from the ED.  I also reached out to OB/GYN on-call who states that he would call her OB clinic to help facilitate close follow-up on their end.  Patient was encouraged to return to the ED if she does develop any worsening pain or if she develops any bleeding.  She was discharged in good condition.  Labs: I Ordered, and personally interpreted labs.  The pertinent results include: Normal electrolytes, no leukocytosis, normal hemoglobin, no urinary infection, hCG of 3449 representing a 75% increase from yesterday.  Imaging Studies ordered: I ordered imaging studies including pelvic ultrasound I independently visualized and interpreted imaging  which did not identify an IUP but did not have suspicious adnexal findings either. I agree with the radiologist interpretation  External records from outside source obtained and reviewed including EMR  Cardiac Monitoring: The patient was maintained on a cardiac monitor.  I personally viewed and interpreted the cardiac monitored which showed an underlying rhythm of: Sinus rhythm  Reevaluation: After the interventions noted above, I reevaluated the patient and found that they have :improved   Social Determinants of Health: Patient has established prenatal care  Disposition: Discharge with 48-hour follow-up  Co morbidities that complicate the patient evaluation  Past Medical History:  Diagnosis Date   Anxiety    Asthma    Hx of eating disorder 11/06/2014   Post traumatic stress disorder (PTSD) 11/06/2014   Social anxiety disorder 11/06/2014   Suicidal ideation 11/06/2014     Medicines Meds ordered this encounter  Medications   acetaminophen (TYLENOL) tablet 650 mg    I have reviewed the patients home medicines and have made adjustments as needed  Problem List / ED Course: Problem List Items Addressed This Visit   None Visit Diagnoses     Lower abdominal pain    -  Primary   Pelvic pain       Relevant Orders   US OB LESS THAN 14 WEEKS WITH OB TRANSVAGINAL (Completed)                   Final Clinical Impression(s) / ED Diagnoses Final diagnoses:  Pelvic pain  Lower abdominal pain    Rx / DC Orders ED Discharge Orders     None         Godfrey Pick, MD 03/02/21 1155

## 2021-03-02 NOTE — ED Triage Notes (Signed)
Patient complaining of lower pain for over a week with lower abdominal pain that has became worse over the past day. Patient is pregnant unknown how far along.

## 2021-03-04 ENCOUNTER — Other Ambulatory Visit: Payer: Medicaid Other

## 2021-03-04 DIAGNOSIS — O3680X0 Pregnancy with inconclusive fetal viability, not applicable or unspecified: Secondary | ICD-10-CM | POA: Diagnosis not present

## 2021-03-05 ENCOUNTER — Emergency Department (HOSPITAL_COMMUNITY)
Admission: EM | Admit: 2021-03-05 | Discharge: 2021-03-05 | Disposition: A | Discharge status: Home / Self Care | Payer: Medicaid Other | Attending: Emergency Medicine | Admitting: Emergency Medicine

## 2021-03-05 ENCOUNTER — Encounter (HOSPITAL_COMMUNITY): Payer: Self-pay | Admitting: *Deleted

## 2021-03-05 ENCOUNTER — Emergency Department (HOSPITAL_COMMUNITY): Payer: Medicaid Other

## 2021-03-05 DIAGNOSIS — Z3201 Encounter for pregnancy test, result positive: Secondary | ICD-10-CM | POA: Diagnosis not present

## 2021-03-05 DIAGNOSIS — Z3A01 Less than 8 weeks gestation of pregnancy: Secondary | ICD-10-CM | POA: Diagnosis not present

## 2021-03-05 DIAGNOSIS — O0001 Abdominal pregnancy with intrauterine pregnancy: Secondary | ICD-10-CM | POA: Insufficient documentation

## 2021-03-05 DIAGNOSIS — R102 Pelvic and perineal pain: Secondary | ICD-10-CM | POA: Insufficient documentation

## 2021-03-05 DIAGNOSIS — O26891 Other specified pregnancy related conditions, first trimester: Secondary | ICD-10-CM | POA: Diagnosis not present

## 2021-03-05 DIAGNOSIS — O0991 Supervision of high risk pregnancy, unspecified, first trimester: Secondary | ICD-10-CM | POA: Insufficient documentation

## 2021-03-05 DIAGNOSIS — Z3490 Encounter for supervision of normal pregnancy, unspecified, unspecified trimester: Secondary | ICD-10-CM

## 2021-03-05 LAB — CBC WITH DIFFERENTIAL/PLATELET
Abs Immature Granulocytes: 0.03 10*3/uL (ref 0.00–0.07)
Basophils Absolute: 0 10*3/uL (ref 0.0–0.1)
Basophils Relative: 1 %
Eosinophils Absolute: 0.1 10*3/uL (ref 0.0–0.5)
Eosinophils Relative: 1 %
HCT: 40.6 % (ref 36.0–46.0)
Hemoglobin: 13.5 g/dL (ref 12.0–15.0)
Immature Granulocytes: 0 %
Lymphocytes Relative: 34 %
Lymphs Abs: 2.6 10*3/uL (ref 0.7–4.0)
MCH: 30 pg (ref 26.0–34.0)
MCHC: 33.3 g/dL (ref 30.0–36.0)
MCV: 90.2 fL (ref 80.0–100.0)
Monocytes Absolute: 0.6 10*3/uL (ref 0.1–1.0)
Monocytes Relative: 8 %
Neutro Abs: 4.4 10*3/uL (ref 1.7–7.7)
Neutrophils Relative %: 56 %
Platelets: 301 10*3/uL (ref 150–400)
RBC: 4.5 MIL/uL (ref 3.87–5.11)
RDW: 11.9 % (ref 11.5–15.5)
WBC: 7.8 10*3/uL (ref 4.0–10.5)
nRBC: 0 % (ref 0.0–0.2)

## 2021-03-05 LAB — COMPREHENSIVE METABOLIC PANEL
ALT: 13 U/L (ref 0–44)
AST: 16 U/L (ref 15–41)
Albumin: 4 g/dL (ref 3.5–5.0)
Alkaline Phosphatase: 51 U/L (ref 38–126)
Anion gap: 6 (ref 5–15)
BUN: 6 mg/dL (ref 6–20)
CO2: 23 mmol/L (ref 22–32)
Calcium: 8.6 mg/dL — ABNORMAL LOW (ref 8.9–10.3)
Chloride: 106 mmol/L (ref 98–111)
Creatinine, Ser: 0.59 mg/dL (ref 0.44–1.00)
GFR, Estimated: >60 mL/min (ref >60)
Glucose, Bld: 58 mg/dL — ABNORMAL LOW (ref 70–99)
Potassium: 3.5 mmol/L (ref 3.5–5.1)
Sodium: 135 mmol/L (ref 135–145)
Total Bilirubin: 0.5 mg/dL (ref 0.3–1.2)
Total Protein: 7.1 g/dL (ref 6.5–8.1)

## 2021-03-05 LAB — URINALYSIS, ROUTINE W REFLEX MICROSCOPIC
Bilirubin Urine: NEGATIVE
Glucose, UA: NEGATIVE mg/dL
Hgb urine dipstick: NEGATIVE
Ketones, ur: NEGATIVE mg/dL
Leukocytes,Ua: NEGATIVE
Nitrite: NEGATIVE
Protein, ur: NEGATIVE mg/dL
Specific Gravity, Urine: <1.005 — ABNORMAL LOW (ref 1.005–1.030)
pH: 6 (ref 5.0–8.0)

## 2021-03-05 LAB — LIPASE, BLOOD: Lipase: 30 U/L (ref 11–51)

## 2021-03-05 LAB — HCG, QUANTITATIVE, PREGNANCY: hCG, Beta Chain, Quant, S: 7497 m[IU]/mL — ABNORMAL HIGH (ref <5)

## 2021-03-05 LAB — BETA HCG QUANT (REF LAB): hCG Quant: 3431 m[IU]/mL

## 2021-03-05 NOTE — ED Provider Notes (Signed)
[facility] EMERGENCY DEPARTMENT Provider Note   CSN: [id number] Arrival date & time: 03/05/21  1807     History  No chief complaint on file.   Kimberly Kidd is a 26 y.o. female.  HPI  This patient is a 26 year old female G2, P1, unsure about her exact dates however she presents with a concern about a declining hCG level.  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  She still has cramping, she had an ultrasound performed the day of her last hCG which showed no intrauterine pregnancy correlating with a quant of over 3000.  This was on March 02, 2021.  Since that time she has continued to have intermittent pelvic discomfort which can be severe at times, there is no vaginal bleeding, she is having some abdominal discomfort at this time.  No fevers or chills, no nausea or vomiting.  I have reviewed the medical record and it appears that the hCG levels have slightly declined in the last 3 days  Home Medications Prior to Admission medications   Medication Sig Start Date End Date Taking? Authorizing Provider  acetaminophen (TYLENOL) 500 MG tablet Take 500 mg by mouth every 6 (six) hours as needed for mild pain or fever.    [provider]      Allergies    Patient has no known allergies.    Review of Systems   Review of Systems  Physical Exam Updated Vital Signs BP 117/66    Pulse 92    Temp 97.9 F (36.6 C) (Oral)    Resp 18    LMP 12/29/2020 (Approximate)    SpO2 100%  Physical Exam  ED Results / Procedures / Treatments   Labs (all labs ordered are listed, but only abnormal results are  displayed) Labs Reviewed  HCG, QUANTITATIVE, PREGNANCY - Abnormal; Notable for the following components:      Result Value   hCG, Beta Chain, Quant, S 7,497 (*)    All other components within normal limits  COMPREHENSIVE METABOLIC PANEL - Abnormal; Notable for the following components:   Glucose, Bld 58 (*)    Calcium 8.6 (*)    All other components within normal limits  URINALYSIS, ROUTINE W REFLEX MICROSCOPIC - Abnormal; Notable for the following components:   Specific Gravity, Urine <1.005 (*)    All other components within normal limits  CBC WITH DIFFERENTIAL/PLATELET  LIPASE, BLOOD    EKG None  Radiology US OB LESS THAN 14 WEEKS WITH OB TRANSVAGINAL  Result Date: 03/05/2021 CLINICAL DATA:  Pelvic pain, positive urine pregnancy test, LMP 12/29/2020 EXAM: OBSTETRIC <14 WK [place] AND TRANSVAGINAL OB US TECHNIQUE: Both transabdominal and transvaginal ultrasound examinations were performed for complete evaluation of the gestation as well as the maternal uterus, adnexal regions, and pelvic cul-de-sac. Transvaginal technique was performed to assess early pregnancy. COMPARISON:  None. FINDINGS: Intrauterine gestational sac: Present (best visualized on transvaginal imaging Yolk sac:  Not visualized Embryo:  Not visualized Cardiac Activity: Not applicable MSD: 6 mm   5 w   2 d CRL: Not applicable Subchorionic hemorrhage:  None visualized. Maternal  uterus/adnexae: The uterus is anteverted but retroflexed. The cervix is unremarkable and is closed. No intrauterine masses are seen. The maternal ovaries are unremarkable save for a corpus luteum noted within the left ovary. No free fluid seen within the cul-de-sac. IMPRESSION: Intrauterine gestational sac. Non visualization of the yolk sac roughly estimates the gestational between 5.0 and 5.5 weeks. Follow-up sonography is recommended in 10-14 days to document appropriate progression and better age the gestation. Electronically Signed   By: Helyn Numbers  M.D.   On: 03/05/2021 20:14    Procedures Procedures    Medications Ordered in ED Medications - No data to display  ED Course/ Medical Decision Making/ A&P                           Medical Decision Making Amount and/or Complexity of Data Reviewed Radiology: ordered.   I discussed the care with Dr. Para March of the gynecology service who recommends repeat ultrasound to confirm or show that there is either an intrauterine pregnancy, miscarriage or ectopic pregnancy, she feels comfortable that if this ultrasound is negative for any acute findings the patient can follow-up outpatient but if there is anything abnormal to reconsult for possible ectopic evaluation.  Ultrasound ordered, staff will be called in  I have personally viewed and interpreted the patient's labs, her hCG is significantly increasing consistent with more of a normal intrauterine pregnancy  I have personally viewed and interpreted her ultrasound, it appears that there is an intrauterine pregnancy, very early correlating with 5-5 and half weeks gestation.  The patient is very stable, vital signs are normal, I discussed the care with a gynecologist, I think it is reasonable for this patient to be discharged home, the patient is agreeable        Final Clinical Impression(s) / ED Diagnoses Final diagnoses:  Pelvic pain  Intrauterine pregnancy     Eber Hong, MD 03/05/21 2027

## 2021-03-05 NOTE — Discharge Instructions (Signed)
Tylenol for pain Your studies here show that you have an early pregnancy, it is in the right place, there is no other complicating factors.  Please see your OB/GYN at your appointment, if you do not have an appointment you should be seen this week, see the phone number above

## 2021-03-05 NOTE — ED Triage Notes (Signed)
Patient states she is pregnant and has had abdominal pain for over 2 weeks, denies vaginal bleeding. States her HCG levels are declining

## 2021-03-10 ENCOUNTER — Telehealth: Payer: Self-pay | Admitting: *Deleted

## 2021-03-10 NOTE — Telephone Encounter (Signed)
Patient is wanting to know her next steps after her labs. Please advise.

## 2021-03-10 NOTE — Telephone Encounter (Signed)
Patient scheduled for next available dating.

## 2021-03-28 ENCOUNTER — Other Ambulatory Visit: Payer: Self-pay | Admitting: Obstetrics & Gynecology

## 2021-03-28 DIAGNOSIS — O3680X0 Pregnancy with inconclusive fetal viability, not applicable or unspecified: Secondary | ICD-10-CM

## 2021-03-29 ENCOUNTER — Other Ambulatory Visit: Payer: Self-pay

## 2021-03-29 ENCOUNTER — Ambulatory Visit (INDEPENDENT_AMBULATORY_CARE_PROVIDER_SITE_OTHER): Payer: Medicaid Other

## 2021-03-29 DIAGNOSIS — O3680X0 Pregnancy with inconclusive fetal viability, not applicable or unspecified: Secondary | ICD-10-CM

## 2021-03-29 DIAGNOSIS — Z3A12 12 weeks gestation of pregnancy: Secondary | ICD-10-CM | POA: Diagnosis not present

## 2021-03-29 NOTE — Progress Notes (Signed)
[place] 8+5 wks,single IUP with YS,FHR 169 bpm,CRL 21.26 mm,normal ovaries

## 2021-03-30 DIAGNOSIS — Z419 Encounter for procedure for purposes other than remedying health state, unspecified: Secondary | ICD-10-CM | POA: Diagnosis not present

## 2021-04-25 ENCOUNTER — Other Ambulatory Visit: Payer: Self-pay | Admitting: Obstetrics & Gynecology

## 2021-04-25 DIAGNOSIS — Z3682 Encounter for antenatal screening for nuchal translucency: Secondary | ICD-10-CM

## 2021-04-26 ENCOUNTER — Ambulatory Visit: Payer: Medicaid Other | Admitting: *Deleted

## 2021-04-26 ENCOUNTER — Ambulatory Visit (INDEPENDENT_AMBULATORY_CARE_PROVIDER_SITE_OTHER): Payer: Medicaid Other | Admitting: Women's Health

## 2021-04-26 ENCOUNTER — Encounter: Payer: Self-pay | Admitting: Women's Health

## 2021-04-26 ENCOUNTER — Other Ambulatory Visit: Payer: Self-pay

## 2021-04-26 ENCOUNTER — Ambulatory Visit (INDEPENDENT_AMBULATORY_CARE_PROVIDER_SITE_OTHER): Payer: Medicaid Other

## 2021-04-26 VITALS — BP 114/66 | HR 89 | Wt 175.0 lb

## 2021-04-26 DIAGNOSIS — Z3A12 12 weeks gestation of pregnancy: Secondary | ICD-10-CM | POA: Diagnosis not present

## 2021-04-26 DIAGNOSIS — Z3480 Encounter for supervision of other normal pregnancy, unspecified trimester: Secondary | ICD-10-CM

## 2021-04-26 DIAGNOSIS — Z3481 Encounter for supervision of other normal pregnancy, first trimester: Secondary | ICD-10-CM

## 2021-04-26 DIAGNOSIS — Z3490 Encounter for supervision of normal pregnancy, unspecified, unspecified trimester: Secondary | ICD-10-CM | POA: Insufficient documentation

## 2021-04-26 DIAGNOSIS — R87610 Atypical squamous cells of undetermined significance on cytologic smear of cervix (ASC-US): Secondary | ICD-10-CM

## 2021-04-26 DIAGNOSIS — R87810 Cervical high risk human papillomavirus (HPV) DNA test positive: Secondary | ICD-10-CM | POA: Diagnosis not present

## 2021-04-26 DIAGNOSIS — Z3682 Encounter for antenatal screening for nuchal translucency: Secondary | ICD-10-CM | POA: Diagnosis not present

## 2021-04-26 LAB — POCT URINALYSIS DIPSTICK OB
Blood, UA: NEGATIVE
Glucose, UA: NEGATIVE
Ketones, UA: NEGATIVE
Leukocytes, UA: NEGATIVE
Nitrite, UA: NEGATIVE
POC,PROTEIN,UA: NEGATIVE

## 2021-04-26 MED ORDER — BLOOD PRESSURE MONITOR MISC: 0 refills | Status: DC

## 2021-04-26 MED ORDER — DOXYLAMINE-PYRIDOXINE 10-10 MG PO TBEC: DELAYED_RELEASE_TABLET | ORAL | 6 refills | Status: DC

## 2021-04-26 NOTE — Patient Instructions (Signed)
Kimberly Kidd, thank you for choosing our office today! We appreciate the opportunity to meet your healthcare needs. You may receive a short survey by mail, e-mail, or through [employer]. If you are happy with your care we would appreciate if you could take just a few minutes to complete the survey questions. We read all of your comments and take your feedback very seriously. Thank you again for choosing our office.  ?Center for [employer] [facility] at [facility] ? [facility] [place] [facility] ?([address] [place], [place] [address]) ?Entrance C, located off of [address] ?Free 24/7 valet parking  ? Nausea & Vomiting ?Have saltine crackers or pretzels by your bed and eat a few bites before you raise your head out of bed in the morning ?Eat small frequent meals throughout the day instead of large meals ?Drink plenty of fluids throughout the day to stay hydrated, just don't drink a lot of fluids with your meals.  This can make your stomach fill up faster making you feel sick ?Do not brush your teeth right after you eat ?Products with real ginger are good for nausea, like ginger ale and ginger hard candy Make sure it says made with real ginger! ?Sucking on sour candy like lemon heads is also good for nausea ?If your prenatal vitamins make you nauseated, take them at night so you will sleep through the nausea ?Sea Bands ?If you feel like you need medicine for the nausea & vomiting please let us know ?If you are unable to keep any fluids or food down please let us know ? ? Constipation ?Drink plenty of fluid, preferably water, throughout the day ?Eat foods high in fiber such as fruits, vegetables, and grains ?Exercise, such as walking, is a good way to keep your bowels regular ?Drink warm fluids, especially warm prune juice, or decaf coffee ?Eat a 1/2 cup of real oatmeal (not instant), 1/2 cup applesauce, and 1/2-1 cup warm prune juice every day ?If needed, you may take Colace (docusate sodium) stool softener  once or twice a day to help keep the stool soft.  ?If you still are having problems with constipation, you may take Miralax once daily as needed to help keep your bowels regular.  ? ?Home Blood Pressure Monitoring for Patients  ? ?Your provider has recommended that you check your blood pressure (BP) at least once a week at home. If you do not have a blood pressure cuff at home, one will be provided for you. Contact your provider if you have not received your monitor within 1 week.  ? ?Helpful Tips for Accurate Home Blood Pressure Checks  ?Don't smoke, exercise, or drink caffeine 30 minutes before checking your BP ?Use the restroom before checking your BP (a full bladder can raise your pressure) ?Relax in a comfortable upright chair ?Feet on the ground ?Left arm resting comfortably on a flat surface at the level of your heart ?Legs uncrossed ?Back supported ?Sit quietly and don't talk ?Place the cuff on your bare arm ?Adjust snuggly, so that only two fingertips can fit between your skin and the top of the cuff ?Check 2 readings separated by at least one minute ?Keep a log of your BP readings ?For a visual, please reference this diagram: http://ccnc.care/bpdiagram ? ?Provider Name: [facility]     Phone: [phone] ? ?Zone 1: ALL CLEAR  ?Continue to monitor your symptoms:  ?BP reading is less than 140 (top number) or less than 90 (bottom  number)  ?No right upper stomach pain ?No headaches or seeing spots ?No feeling nauseated or throwing up ?No swelling in face and hands ? ?Zone 2: CAUTION ?Call your doctor's office for any of the following:  ?BP reading is greater than 140 (top number) or greater than 90 (bottom number)  ?Stomach pain under your ribs in the middle or right side ?Headaches or seeing spots ?Feeling nauseated or throwing up ?Swelling in face and hands ? ?Zone 3: EMERGENCY  ?Seek immediate medical care if you have any of the following:  ?BP reading is greater than160 (top number) or greater than  110 (bottom number) ?Severe headaches not improving with Tylenol ?Serious difficulty catching your breath ?Any worsening symptoms from Zone 2  ? ? First Trimester of Pregnancy ?The first trimester of pregnancy is from week 1 until the end of week 12 (months 1 through 3). A week after a sperm fertilizes an egg, the egg will implant on the wall of the uterus. This embryo will begin to develop into a baby. Genes from you and your partner are forming the baby. The female genes determine whether the baby is a boy or a girl. At 6-8 weeks, the eyes and face are formed, and the heartbeat can be seen on ultrasound. At the end of 12 weeks, all the baby's organs are formed.  ?Now that you are pregnant, you will want to do everything you can to have a healthy baby. Two of the most important things are to get good prenatal care and to follow your health care provider's instructions. Prenatal care is all the medical care you receive before the baby's birth. This care will help prevent, find, and treat any problems during the pregnancy and childbirth. ?BODY CHANGES ?Your body goes through many changes during pregnancy. The changes vary from woman to woman.  ?You may gain or lose a couple of pounds at first. ?You may feel sick to your stomach (nauseous) and throw up (vomit). If the vomiting is uncontrollable, call your health care provider. ?You may tire easily. ?You may develop headaches that can be relieved by medicines approved by your health care provider. ?You may urinate more often. Painful urination may mean you have a bladder infection. ?You may develop heartburn as a result of your pregnancy. ?You may develop constipation because certain hormones are causing the muscles that push waste through your intestines to slow down. ?You may develop hemorrhoids or swollen, bulging veins (varicose veins). ?Your breasts may begin to grow larger and become tender. Your nipples may stick out more, and the tissue that surrounds them  (areola) may become darker. ?Your gums may bleed and may be sensitive to brushing and flossing. ?Dark spots or blotches (chloasma, mask of pregnancy) may develop on your face. This will likely fade after the baby is born. ?Your menstrual periods will stop. ?You may have a loss of appetite. ?You may develop cravings for certain kinds of food. ?You may have changes in your emotions from day to day, such as being excited to be pregnant or being concerned that something may go wrong with the pregnancy and baby. ?You may have more vivid and strange dreams. ?You may have changes in your hair. These can include thickening of your hair, rapid growth, and changes in texture. Some women also have hair loss during or after pregnancy, or hair that feels dry or thin. Your hair will most likely return to normal after your baby is born. ?WHAT TO EXPECT AT YOUR PRENATAL  VISITS ?During a routine prenatal visit: ?You will be weighed to make sure you and the baby are growing normally. ?Your blood pressure will be taken. ?Your abdomen will be measured to track your baby's growth. ?The fetal heartbeat will be listened to starting around week 10 or 12 of your pregnancy. ?Test results from any previous visits will be discussed. ?Your health care provider may ask you: ?How you are feeling. ?If you are feeling the baby move. ?If you have had any abnormal symptoms, such as leaking fluid, bleeding, severe headaches, or abdominal cramping. ?If you have any questions. ?Other tests that may be performed during your first trimester include: ?Blood tests to find your blood type and to check for the presence of any previous infections. They will also be used to check for low iron levels (anemia) and Rh antibodies. Later in the pregnancy, blood tests for diabetes will be done along with other tests if problems develop. ?Urine tests to check for infections, diabetes, or protein in the urine. ?An ultrasound to confirm the proper growth and development  of the baby. ?An amniocentesis to check for possible genetic problems. ?Fetal screens for spina bifida and Down syndrome. ?You may need other tests to make sure you and the baby are doing well. ?HOME CAR

## 2021-04-26 NOTE — Progress Notes (Signed)
? ? ?INITIAL OBSTETRICAL VISIT ?Patient name: Kimberly Kidd MRN [id number]  Date of birth: 1995/07/29 ?Chief Complaint:   ?Initial Prenatal Visit (Dizzy spells at work) ? ?History of Present Illness:   ?Kimberly Kidd is a 26 y.o. G75P1001 Caucasian female at [redacted]w[redacted]d by [place] at 8 weeks with an Estimated Date of Delivery: 11/03/21 being seen today for her initial obstetrical visit.   ?Patient's last menstrual period was 12/29/2020 (approximate). ?Her obstetrical history is significant for  term uncomplicated SVB x 1 .   ?Today she reports N/V, requests meds ?Last pap 05/14/19. Results were: NILM w/ HRHPV not done ? ? ?  04/26/2021  ?  2:23 PM 05/14/2019  ?  1:47 PM 04/11/2018  ? 11:27 AM 03/19/2018  ?  3:22 PM  ?Depression screen PHQ 2/9  ?Decreased Interest 0 1 0 0  ?Down, Depressed, Hopeless 1 1 0 0  ?PHQ - 2 Score 1 2 0 0  ?Altered sleeping 0 1 0   ?Tired, decreased energy 2 0 0   ?Change in appetite 1 0 0   ?Feeling bad or failure about yourself  0 0 0   ?Trouble concentrating 0 0 1   ?Moving slowly or fidgety/restless 0 0 0   ?Suicidal thoughts 0 0 0   ?PHQ-9 Score 4 3 1    ? ?  ? ?  04/26/2021  ?  2:26 PM 05/14/2019  ?  1:47 PM  ?GAD 7 : Generalized Anxiety Score  ?Nervous, Anxious, on Edge 1 2  ?Control/stop worrying 0 1  ?Worry too much - different things 0 1  ?Trouble relaxing 0 0  ?Restless 0 0  ?Easily annoyed or irritable 0 0  ?Afraid - awful might happen 1 0  ?Total GAD 7 Score 2 4  ? ? ? ?Review of Systems:   ?Pertinent items are noted in HPI ?Denies cramping/contractions, leakage of fluid, vaginal bleeding, abnormal vaginal discharge w/ itching/odor/irritation, headaches, visual changes, shortness of breath, chest pain, abdominal pain, severe nausea/vomiting, or problems with urination or bowel movements unless otherwise stated above.  ?Pertinent History Reviewed:  ?Reviewed past medical,surgical, social, obstetrical and family history.  ?Reviewed problem list, medications and allergies. ?OB History  ?Gravida  Para Term Preterm AB Living  ?2 1 1     1   ?SAB IAB Ectopic Multiple Live Births  ?      0 1  ?  ?# Outcome Date GA Lbr Len/2nd Weight Sex Delivery Anes PTL Lv  ?2 Current           ?1 Term 09/12/18 [redacted]w[redacted]d 26:54 / 00:21 6 lb 14.8 oz (3.14 kg) F Vag-Spont EPI N LIV  ? ?Physical Assessment:  ? ?Vitals:  ? 04/26/21 1346  ?BP: 114/66  ?Pulse: 89  ?Weight: 175 lb (79.4 kg)  ?Body mass index is 31 kg/m?. ? ?     Physical Examination: ? General appearance - well appearing, and in no distress ? Mental status - alert, oriented to person, place, and time ? Psych:  She has a normal mood and affect ? Skin - warm and dry, normal color, no suspicious lesions noted ? Chest - effort normal, all lung fields clear to auscultation bilaterally ? Heart - normal rate and regular rhythm ? Abdomen - soft, nontender ? Extremities:  No swelling or varicosities noted ? Thin prep pap is not done  ? ?Chaperone: N/A   ? ?TODAY'S NT [redacted]w[redacted]d 12+5 wks,measurements c/w dates,CRL 63.76 mm,FHR 171 bpm,NB present,NT 1.6 mm,normal ovaries  ? ?  Results for orders placed or performed in visit on 04/26/21 (from the past 24 hour(s))  ?POC Urinalysis Dipstick OB  ? Collection Time: 04/26/21  2:32 PM  ?Result Value Ref Range  ? Color, UA    ? Clarity, UA    ? Glucose, UA Negative Negative  ? Bilirubin, UA    ? Ketones, UA neg   ? Spec Grav, UA    ? Blood, UA neg   ? pH, UA    ? POC,PROTEIN,UA Negative Negative, Trace, Small (1+), Moderate (2+), Large (3+), 4+  ? Urobilinogen, UA    ? Nitrite, UA neg   ? Leukocytes, UA Negative Negative  ? Appearance    ? Odor    ?  ?Assessment & Plan:  ?1) Low-Risk Pregnancy G2P1001 at [redacted]w[redacted]d with an Estimated Date of Delivery: 11/03/21  ? ?2) Initial OB visit ? ?3) N/V> rx diclegis ? ?Meds:  ?Meds ordered this encounter  ?Medications  ? Blood Pressure Monitor MISC  ?  Sig: For regular home bp monitoring during pregnancy  ?  Dispense:  1 each  ?  Refill:  0  ?  Z34.81 ?Please mail to patient  ? Doxylamine-Pyridoxine (DICLEGIS) 10-10  MG TBEC  ?  Sig: 2 tabs q hs, if sx persist add 1 tab q am on day 3, if sx persist add 1 tab q afternoon on day 4  ?  Dispense:  100 tablet  ?  Refill:  6  ?  Order Specific Question:   Supervising Provider  ?  Answer:   Duane Lope H [[id number]]  ? ? ?Initial labs obtained ?Continue prenatal vitamins ?Reviewed n/v relief measures and warning s/s to report ?Reviewed recommended weight gain based on pre-gravid BMI ?Encouraged well-balanced diet ?Genetic & carrier screening discussed: requests Panorama, NT/IT, and Horizon  ?Ultrasound discussed; fetal survey: requested ?CCNC completed> form faxed if has or is planning to apply for medicaid ?The nature of [place] [facility] with multiple MDs and other Advanced Practice Providers was explained to patient; also emphasized that fellows, residents, and students are part of our team. ?Does not have home bp cuff. Office bp cuff given: no. Rx sent: yes. Check bp weekly, let us know if consistently >140/90.  ? ?Follow-up: Return in about 4 weeks (around 05/24/2021) for LROB, 2nd IT, CNM, in person.  ? ?Orders Placed This Encounter  ?Procedures  ? Urine Culture  ? GC/Chlamydia Probe Amp  ? Integrated 1  ? Genetic Screening  ? CBC/D/Plt+RPR+Rh+ABO+RubIgG...  ? POC Urinalysis Dipstick OB  ? ? ?Cheral Marker CNM, WHNP-BC ?04/26/2021 ?2:45 PM  ?

## 2021-04-26 NOTE — Progress Notes (Signed)
[place] 12+5 wks,measurements c/w dates,CRL 63.76 mm,FHR 171 bpm,NB present,NT 1.6 mm,normal ovaries  ?

## 2021-04-28 LAB — CBC/D/PLT+RPR+RH+ABO+RUBIGG...
Antibody Screen: NEGATIVE
HCV Ab: NONREACTIVE
HIV Screen 4th Generation wRfx: NONREACTIVE
Hepatitis B Surface Ag: NEGATIVE
RPR Ser Ql: NONREACTIVE
Rh Factor: POSITIVE
Rubella Antibodies, IGG: 1.32 index (ref >0.99)

## 2021-04-28 LAB — HCV INTERPRETATION

## 2021-04-28 LAB — GC/CHLAMYDIA PROBE AMP
Chlamydia trachomatis, NAA: NEGATIVE
Neisseria Gonorrhoeae by PCR: NEGATIVE

## 2021-04-28 LAB — INTEGRATED 1
Crown Rump Length: 63.8 mm
Gest. Age on Collection Date: 12.6 weeks
Maternal Age at EDD: 24.9 yr
Nuchal Translucency (NT): 1.6 mm
Number of Fetuses: 1
PAPP-A Value: 828 ng/mL
Weight: 175 [lb_av]

## 2021-04-29 LAB — URINE CULTURE

## 2021-04-30 DIAGNOSIS — Z419 Encounter for procedure for purposes other than remedying health state, unspecified: Secondary | ICD-10-CM | POA: Diagnosis not present

## 2021-05-03 ENCOUNTER — Encounter: Payer: Self-pay | Admitting: Women's Health

## 2021-05-09 ENCOUNTER — Telehealth: Payer: Self-pay

## 2021-05-09 NOTE — Telephone Encounter (Signed)
Patient states her employer has recommended that she come out of work due to her being dizzy, standing on her feet for 12 hours, etc.  Informed patient that unless she has a medical reason to be taken out of work, we would not take her out.  Would need to discuss with provider at her next visit regarding going from 12 hours to 8 hours, employer to provider more frequent breaks, etc.  Informed dizziness is common in pregnancy and could be due to shift in blood volume, dehydration or changes in blood sugar.  Advised to increase fluids and eat small frequent snacks throughout her day.  Pt verbalized understanding and will discuss at her next visit.  ? ?

## 2021-05-09 NOTE — Telephone Encounter (Signed)
PT CALLED AND STATED THAT HER EMPLOYER SUGGESTED THAT SHE COME OUT OF WORK EARLY.  I TOLD HER THAT I WOULD SEND A MESSAGE BACK TO THE NURSE AND THAT THEY WOULD GET BACK WITH HER. ?

## 2021-05-13 ENCOUNTER — Encounter: Payer: Self-pay | Admitting: Women's Health

## 2021-05-21 DIAGNOSIS — Z3481 Encounter for supervision of other normal pregnancy, first trimester: Secondary | ICD-10-CM | POA: Diagnosis not present

## 2021-05-24 ENCOUNTER — Encounter: Payer: Medicaid Other | Admitting: Obstetrics & Gynecology

## 2021-05-30 DIAGNOSIS — Z419 Encounter for procedure for purposes other than remedying health state, unspecified: Secondary | ICD-10-CM | POA: Diagnosis not present

## 2021-06-06 ENCOUNTER — Encounter: Payer: Medicaid Other | Admitting: Obstetrics & Gynecology

## 2021-06-07 ENCOUNTER — Ambulatory Visit (INDEPENDENT_AMBULATORY_CARE_PROVIDER_SITE_OTHER): Payer: Medicaid Other | Admitting: Women's Health

## 2021-06-07 ENCOUNTER — Encounter: Payer: Self-pay | Admitting: Women's Health

## 2021-06-07 VITALS — BP 119/73 | HR 88 | Wt 182.0 lb

## 2021-06-07 DIAGNOSIS — Z363 Encounter for antenatal screening for malformations: Secondary | ICD-10-CM

## 2021-06-07 DIAGNOSIS — Z1379 Encounter for other screening for genetic and chromosomal anomalies: Secondary | ICD-10-CM

## 2021-06-07 DIAGNOSIS — Z3482 Encounter for supervision of other normal pregnancy, second trimester: Secondary | ICD-10-CM

## 2021-06-07 DIAGNOSIS — Z3480 Encounter for supervision of other normal pregnancy, unspecified trimester: Secondary | ICD-10-CM

## 2021-06-07 NOTE — Patient Instructions (Signed)
Kimberly Kidd, thank you for choosing our office today! We appreciate the opportunity to meet your healthcare needs. You may receive a short survey by mail, e-mail, or through [employer]. If you are happy with your care we would appreciate if you could take just a few minutes to complete the survey questions. We read all of your comments and take your feedback very seriously. Thank you again for choosing our office.  ?Center for [employer] Team at [facility] ?[facility] at [facility] ?([address] [place], [place] [address]) ?Entrance C, located off of [address] ?Free 24/7 valet parking  ?Go to [email or web address] to register for FREE online childbirth classes ? ?Call the office [phone]) or go to [facility] if: ?You begin to severe cramping ?Your water breaks.  Sometimes it is a big gush of fluid, sometimes it is just a trickle that keeps getting your panties wet or running down your legs ?You have vaginal bleeding.  It is normal to have a small amount of spotting if your cervix was checked.  ? ?[place] Pediatricians/Family Doctors ?[facility]): [address], [phone]           ?[facility]: [address], [phone]                ?[facility]): [address], [phone] (call to ask if accepting patients) ?[facility] Department: [address], [place], [phone]   ? ?[place] Pediatricians/Family Doctors ?[facility]): [address], [phone] ?[facility]: [address] [place], [phone] ?[facility] [place]: [address], [phone] ? ?[employer] Family Doctors  ?[facility]): [phone] ?[facility]: [address], [phone]  ? ?[facility] Family Doctors ?[facility]: [address] [phone] ? ?[facility]  ?[facility]: [phone], [phone] ? ?Home Blood Pressure Monitoring for Patients  ? ?Your provider has recommended that you check your blood pressure (BP) at least once a week at home. If you do not have a blood pressure cuff at home, one will be provided for you. Contact your provider if you have not received your monitor within 1 week.  ? ?Helpful Tips for Accurate Home Blood Pressure Checks  ?Don't smoke, exercise, or drink caffeine 30 minutes before checking your BP ?Use the restroom before checking your BP (a full bladder can raise your pressure) ?Relax in a comfortable upright chair ?Feet on the ground ?Left arm resting comfortably on a flat surface at the level of your heart ?Legs uncrossed ?Back supported ?Sit quietly and don't talk ?Place the cuff on your bare arm ?Adjust snuggly, so that only two fingertips can fit between your skin and the top of the cuff ?Check 2 readings separated by at least one minute ?Keep a log of your BP readings ?For a visual, please reference this diagram: http://ccnc.care/bpdiagram ? ?Provider Name: [facility]     Phone: [phone] ? ?Zone 1: ALL CLEAR  ?Continue to monitor your symptoms:  ?BP reading is less than 140 (top number) or less than 90 (bottom number)  ?No right upper stomach pain ?No headaches or seeing spots ?No feeling nauseated or throwing up ?No swelling in face and hands ? ?Zone 2: CAUTION ?Call your doctor's office for any of the following:  ?BP reading is greater than 140 (top number) or greater than  90 (bottom number)  ?Stomach pain under your ribs in the middle or right side ?Headaches or seeing spots ?Feeling nauseated or throwing up ?Swelling in face and hands ? ?Zone 3: EMERGENCY  ?Seek immediate medical care if you have any of the following:  ?BP reading is greater than160 (top number) or greater than 110 (bottom number) ?Severe headaches not improving with Tylenol ?Serious difficulty catching your breath ?Any worsening symptoms from  Zone 2  ? ?  ?Second Trimester of Pregnancy ?The second trimester is from week 14 through week 27 (months 4 through 6). The second trimester is often a time when you feel your best. Your body has adjusted to being pregnant, and you begin to feel better physically. Usually, morning sickness has lessened or quit completely, you may have more energy, and you may have an increase in appetite. The second trimester is also a time when the fetus is growing rapidly. At the end of the sixth month, the fetus is about 9 inches long and weighs about 1? pounds. You will likely begin to feel the baby move (quickening) between 16 and 20 weeks of pregnancy. ?Body changes during your second trimester ?Your body continues to go through many changes during your second trimester. The changes vary from woman to woman. ?Your weight will continue to increase. You will notice your lower abdomen bulging out. ?You may begin to get stretch marks on your hips, abdomen, and breasts. ?You may develop headaches that can be relieved by medicines. The medicines should be approved by your health care provider. ?You may urinate more often because the fetus is pressing on your bladder. ?You may develop or continue to have heartburn as a result of your pregnancy. ?You may develop constipation because certain hormones are causing the muscles that push waste through your intestines to slow down. ?You may develop hemorrhoids or swollen, bulging veins (varicose veins). ?You may have back pain. This is caused by: ?Weight gain. ?Pregnancy hormones that are relaxing the joints in your pelvis. ?A shift in weight and the muscles that support your balance. ?Your breasts will continue to grow and they will continue to become tender. ?Your gums may bleed and may be sensitive to brushing and flossing. ?Dark spots or blotches (chloasma, mask of pregnancy) may develop on your face. This will likely fade after the baby is born. ?A dark line from your belly button to  the pubic area (linea nigra) may appear. This will likely fade after the baby is born. ?You may have changes in your hair. These can include thickening of your hair, rapid growth, and changes in texture. Some women also have hair loss during or after pregnancy, or hair that feels dry or thin. Your hair will most likely return to normal after your baby is born. ? ?What to expect at prenatal visits ?During a routine prenatal visit: ?You will be weighed to make sure you and the fetus are growing normally. ?Your blood pressure will be taken. ?Your abdomen will be measured to track your baby's growth. ?The fetal heartbeat will be listened to. ?Any test results from the previous visit will be discussed. ? ?Your health care provider may ask you: ?How you are feeling. ?If you are feeling the baby move. ?If you have had any abnormal symptoms, such as leaking fluid, bleeding, severe headaches, or abdominal cramping. ?If you are using any tobacco products, including cigarettes, chewing tobacco, and electronic cigarettes. ?If you have any questions. ? ?Other tests that may be performed during  your second trimester include: ?Blood tests that check for: ?Low iron levels (anemia). ?High blood sugar that affects pregnant women (gestational diabetes) between 59 and 28 weeks. ?Rh antibodies. This is to check for a protein on red blood cells (Rh factor). ?Urine tests to check for infections, diabetes, or protein in the urine. ?An ultrasound to confirm the proper growth and development of the baby. ?An amniocentesis to check for possible genetic problems. ?Fetal screens for spina bifida and Down syndrome. ?HIV (human immunodeficiency virus) testing. Routine prenatal testing includes screening for HIV, unless you choose not to have this test. ? ?Follow these instructions at home: ?Medicines ?Follow your health care provider's instructions regarding medicine use. Specific medicines may be either safe or unsafe to take during  pregnancy. ?Take a prenatal vitamin that contains at least 600 micrograms (mcg) of folic acid. ?If you develop constipation, try taking a stool softener if your health care provider approves. ?Eating and drinking ?Eat

## 2021-06-07 NOTE — Progress Notes (Signed)
? ? ?LOW-RISK PREGNANCY VISIT ?Patient name: Kimberly Kidd MRN [id number]  Date of birth: [birth year] ?Chief Complaint:   ?Routine Prenatal Visit (2nd IT today; forgetting a lot of stuff) ? ?History of Present Illness:   ?Rozelle Caudle is a 26 y.o. G30P1001 female at [redacted]w[redacted]d with an Estimated Date of Delivery: 11/03/21 being seen today for ongoing management of a low-risk pregnancy.  ? ?Today she reports  forgot appt yesterday, thought it was today, feels like she is forgetting a lot of things . Not getting lost, forgetting where she is going, etc, just 'little things'.  Contractions: Not present. Vag. Bleeding: None.  Movement: Present. denies leaking of fluid. ? ? ?  04/26/2021  ?  2:23 PM 05/14/2019  ?  1:47 PM 04/11/2018  ? 11:27 AM 03/19/2018  ?  3:22 PM  ?Depression screen PHQ 2/9  ?Decreased Interest 0 1 0 0  ?Down, Depressed, Hopeless 1 1 0 0  ?PHQ - 2 Score 1 2 0 0  ?Altered sleeping 0 1 0   ?Tired, decreased energy 2 0 0   ?Change in appetite 1 0 0   ?Feeling bad or failure about yourself  0 0 0   ?Trouble concentrating 0 0 1   ?Moving slowly or fidgety/restless 0 0 0   ?Suicidal thoughts 0 0 0   ?PHQ-9 Score 4 3 1    ? ?  ? ?  04/26/2021  ?  2:26 PM 05/14/2019  ?  1:47 PM  ?GAD 7 : Generalized Anxiety Score  ?Nervous, Anxious, on Edge 1 2  ?Control/stop worrying 0 1  ?Worry too much - different things 0 1  ?Trouble relaxing 0 0  ?Restless 0 0  ?Easily annoyed or irritable 0 0  ?Afraid - awful might happen 1 0  ?Total GAD 7 Score 2 4  ? ? ?  ?Review of Systems:   ?Pertinent items are noted in HPI ?Denies abnormal vaginal discharge w/ itching/odor/irritation, headaches, visual changes, shortness of breath, chest pain, abdominal pain, severe nausea/vomiting, or problems with urination or bowel movements unless otherwise stated above. ?Pertinent History Reviewed:  ?Reviewed past medical,surgical, social, obstetrical and family history.  ?Reviewed problem list, medications and allergies. ?Physical Assessment:   ? ?Vitals:  ? 06/07/21 1510  ?BP: 119/73  ?Pulse: 88  ?Weight: 182 lb (82.6 kg)  ?Body mass index is 32.24 kg/m?. ?  ?     Physical Examination:  ? General appearance: Well appearing, and in no distress ? Mental status: Alert, oriented to person, place, and time ? Skin: Warm & dry ? Cardiovascular: Normal heart rate noted ? Respiratory: Normal respiratory effort, no distress ? Abdomen: Soft, gravid, nontender ? Pelvic: Cervical exam deferred        ? Extremities: Edema: Trace ? ?Fetal Status: Fetal Heart Rate (bpm): 154   Movement: Present   ? ?Chaperone: N/A   ?No results found for this or any previous visit (from the past 24 hour(s)).  ?Assessment & Plan:  ?1) Low-risk pregnancy G2P1001 at [redacted]w[redacted]d with an Estimated Date of Delivery: 11/03/21  ? ?2) Forgetful, discussed some can be normal during pregnancy, if worsens let 01/03/22 know ?  ?Meds: No orders of the defined types were placed in this encounter. ? ?Labs/procedures today: 2nd IT ? ?Plan:  Continue routine obstetrical care  ?Next visit: prefers will be in person for u/s    ? ?Reviewed: Preterm labor symptoms and general obstetric precautions including but not limited to vaginal bleeding, contractions, leaking of fluid  and fetal movement were reviewed in detail with the patient.  All questions were answered. Does have home bp cuff. Office bp cuff given: not applicable. Check bp weekly, let us know if consistently >140 and/or >90. ? ?Follow-up: Return for ASAP, NO:IBBCWUG (no visit); then 4wks from now for [facility] w/ CNM in person. ? ?Future Appointments  ?Date Time Provider Department Center  ?06/07/2021  4:10 PM Cheral Marker, CNM [facility]  ?06/22/2021 11:45 AM [facility] [place] [facility] None  ?07/05/2021  4:10 PM Cheral Marker, CNM [facility]  ? ? ?Orders Placed This Encounter  ?Procedures  ? US OB Comp + 14 Wk  ? INTEGRATED 2  ? ?Cheral Marker CNM, WHNP-BC ?06/07/2021 ?3:35 PM  ?

## 2021-06-09 LAB — INTEGRATED 2
AFP MoM: 0.8
Alpha-Fetoprotein: 31.7 ng/mL
Crown Rump Length: 63.8 mm
DIA MoM: 0.92
DIA Value: 129.2 pg/mL
Estriol, Unconjugated: 1.83 ng/mL
Gest. Age on Collection Date: 12.6 weeks
Gestational Age: 18.6 weeks
Maternal Age at EDD: 24.9 yr
Nuchal Translucency (NT): 1.6 mm
Nuchal Translucency MoM: 1.12
Number of Fetuses: 1
PAPP-A MoM: 0.95
PAPP-A Value: 828 ng/mL
Test Results:: NEGATIVE
Weight: 175 [lb_av]
Weight: 182 [lb_av]
hCG MoM: 1.9
hCG Value: 42 IU/mL
uE3 MoM: 1.21

## 2021-06-22 ENCOUNTER — Ambulatory Visit (INDEPENDENT_AMBULATORY_CARE_PROVIDER_SITE_OTHER): Payer: Medicaid Other

## 2021-06-22 DIAGNOSIS — Z3480 Encounter for supervision of other normal pregnancy, unspecified trimester: Secondary | ICD-10-CM | POA: Diagnosis not present

## 2021-06-22 DIAGNOSIS — Z363 Encounter for antenatal screening for malformations: Secondary | ICD-10-CM | POA: Diagnosis not present

## 2021-06-22 DIAGNOSIS — Z3A20 20 weeks gestation of pregnancy: Secondary | ICD-10-CM | POA: Diagnosis not present

## 2021-06-22 NOTE — Progress Notes (Signed)
[place] 20+6 wks,cephalic,posterior placenta gr 0,normal ovaries,cx 4.1 cm,SVP of fluid 3.8 cm,FHR 148 bpm,EFW 408 g 64%,anatomy complete,no obvious abnormalities

## 2021-06-30 DIAGNOSIS — Z419 Encounter for procedure for purposes other than remedying health state, unspecified: Secondary | ICD-10-CM | POA: Diagnosis not present

## 2021-07-05 ENCOUNTER — Ambulatory Visit (INDEPENDENT_AMBULATORY_CARE_PROVIDER_SITE_OTHER): Payer: Medicaid Other | Admitting: Women's Health

## 2021-07-05 ENCOUNTER — Encounter: Payer: Self-pay | Admitting: Women's Health

## 2021-07-05 VITALS — BP 121/76 | HR 89 | Wt 189.0 lb

## 2021-07-05 DIAGNOSIS — Z3480 Encounter for supervision of other normal pregnancy, unspecified trimester: Secondary | ICD-10-CM

## 2021-07-05 DIAGNOSIS — Z3482 Encounter for supervision of other normal pregnancy, second trimester: Secondary | ICD-10-CM

## 2021-07-05 NOTE — Patient Instructions (Signed)
Kimberly Kidd, thank you for choosing our office today! We appreciate the opportunity to meet your healthcare needs. You may receive a short survey by mail, e-mail, or through [employer]. If you are happy with your care we would appreciate if you could take just a few minutes to complete the survey questions. We read all of your comments and take your feedback very seriously. Thank you again for choosing our office.  Center for [employer] Team at [place] at [facility] ([place], [place] [address]) Entrance C, located off of [address] parking   You will have your sugar test next visit.  Please do not eat or drink anything after midnight the night before you come, not even water.  You will be here for at least two hours.  Please make an appointment online for the bloodwork at [email or web address] for 8:00am (or as close to this as possible). Make sure you select the [facility] service center.   CLASSES: Go to [email or web address] to register for classes (childbirth, breastfeeding, waterbirth, infant CPR, daddy bootcamp, etc.)  Call the office [phone]) or go to [facility] if: You begin to have strong, frequent contractions Your water breaks.  Sometimes it is a big gush of fluid, sometimes it is just a trickle that keeps getting your panties wet or running down your legs You have vaginal bleeding.  It is normal to have a small amount of spotting if your cervix was checked.  You don't feel your baby moving like normal.  If you don't, get you something to eat and drink and lay down and focus on feeling your baby move.   If your baby is still not moving like normal, you should call the office or go to [facility]  Call the office [phone]) or go to [facility] hospital for these signs of pre-eclampsia: Severe headache that does not go away with Tylenol Visual changes- seeing spots, double, blurred vision Pain under your right breast or upper  abdomen that does not go away with Tums or heartburn medicine Nausea and/or vomiting Severe swelling in your hands, feet, and face    [facility] Pediatricians/Family Doctors [facility]): [address] [place], [place]: [address] [place], [facility]): [place], [phone] (call to ask if accepting patients) [facility] Department: [address] [place], [place] [facility]): [address] [place], [place] [facility]: [address] [place], [place] [facility] of [place]: [place], [place] [facility]): [phone] [facility]: [address], [address]: [address] [place] [facility]: [phone], [phone]  Home Blood Pressure Monitoring for Patients   Your provider has recommended that you check your blood pressure (BP) at least once a week at home. If you do not have a blood pressure cuff at home, one will be provided for you. Contact your provider if you have not received your monitor within 1 week.   Helpful Tips for Accurate Home Blood Pressure Checks  Don't smoke, exercise, or drink  caffeine 30 minutes before checking your BP Use the restroom before checking your BP (a full bladder can raise your pressure) Relax in a comfortable upright chair Feet on the ground Left arm resting comfortably on a flat surface at the level of your heart Legs uncrossed Back supported Sit quietly and don't talk Place the cuff on your bare arm Adjust snuggly, so that only two fingertips can fit between your skin and the top of the cuff Check 2  readings separated by at least one minute Keep a log of your BP readings For a visual, please reference this diagram: http://ccnc.care/bpdiagram  Provider Name: [facility]     Phone: [phone]  Zone 1: ALL CLEAR  Continue to monitor your symptoms:  BP reading is less than 140 (top number) or less than 90 (bottom number)  No right upper stomach pain No headaches or seeing spots No feeling nauseated or throwing up No swelling in face and hands  Zone 2: CAUTION Call your doctor's office for any of the following:  BP reading is greater than 140 (top number) or greater than 90 (bottom number)  Stomach pain under your ribs in the middle or right side Headaches or seeing spots Feeling nauseated or throwing up Swelling in face and hands  Zone 3: EMERGENCY  Seek immediate medical care if you have any of the following:  BP reading is greater than160 (top number) or greater than 110 (bottom number) Severe headaches not improving with Tylenol Serious difficulty catching your breath Any worsening symptoms from Zone 2   Second Trimester of Pregnancy The second trimester is from week 13 through week 28, months 4 through 6. The second trimester is often a time when you feel your best. Your body has also adjusted to being pregnant, and you begin to feel better physically. Usually, morning sickness has lessened or quit completely, you may have more energy, and you may have an increase in appetite. The second trimester is also a time when the fetus is growing rapidly. At the end of the sixth month, the fetus is about 9 inches long and weighs about 1 pounds. You will likely begin to feel the baby move (quickening) between 18 and 20 weeks of the pregnancy. BODY CHANGES Your body goes through many changes during pregnancy. The changes vary from woman to woman.  Your weight will continue to increase. You will notice your lower abdomen bulging out. You may begin to get stretch marks on your  hips, abdomen, and breasts. You may develop headaches that can be relieved by medicines approved by your health care provider. You may urinate more often because the fetus is pressing on your bladder. You may develop or continue to have heartburn as a result of your pregnancy. You may develop constipation because certain hormones are causing the muscles that push waste through your intestines to slow down. You may develop hemorrhoids or swollen, bulging veins (varicose veins). You may have back pain because of the weight gain and pregnancy hormones relaxing your joints between the bones in your pelvis and as a result of a shift in weight and the muscles that support your balance. Your breasts will continue to grow and be tender. Your gums may bleed and may be sensitive to brushing and flossing. Dark spots or blotches (chloasma, mask of pregnancy) may develop on your face. This will likely fade after the baby is born. A dark line from your belly button to the pubic area (linea nigra) may appear. This  will likely fade after the baby is born. You may have changes in your hair. These can include thickening of your hair, rapid growth, and changes in texture. Some women also have hair loss during or after pregnancy, or hair that feels dry or thin. Your hair will most likely return to normal after your baby is born. WHAT TO EXPECT AT YOUR PRENATAL VISITS During a routine prenatal visit: You will be weighed to make sure you and the fetus are growing normally. Your blood pressure will be taken. Your abdomen will be measured to track your baby's growth. The fetal heartbeat will be listened to. Any test results from the previous visit will be discussed. Your health care provider may ask you: How you are feeling. If you are feeling the baby move. If you have had any abnormal symptoms, such as leaking fluid, bleeding, severe headaches, or abdominal cramping. If you have any questions. Other tests that may  be performed during your second trimester include: Blood tests that check for: Low iron levels (anemia). Gestational diabetes (between 24 and 28 weeks). Rh antibodies. Urine tests to check for infections, diabetes, or protein in the urine. An ultrasound to confirm the proper growth and development of the baby. An amniocentesis to check for possible genetic problems. Fetal screens for spina bifida and Down syndrome. HOME CARE INSTRUCTIONS  Avoid all smoking, herbs, alcohol, and unprescribed drugs. These chemicals affect the formation and growth of the baby. Follow your health care provider's instructions regarding medicine use. There are medicines that are either safe or unsafe to take during pregnancy. Exercise only as directed by your health care provider. Experiencing uterine cramps is a good sign to stop exercising. Continue to eat regular, healthy meals. Wear a good support bra for breast tenderness. Do not use hot tubs, steam rooms, or saunas. Wear your seat belt at all times when driving. Avoid raw meat, uncooked cheese, cat litter boxes, and soil used by cats. These carry germs that can cause birth defects in the baby. Take your prenatal vitamins. Try taking a stool softener (if your health care provider approves) if you develop constipation. Eat more high-fiber foods, such as fresh vegetables or fruit and whole grains. Drink plenty of fluids to keep your urine clear or pale yellow. Take warm sitz baths to soothe any pain or discomfort caused by hemorrhoids. Use hemorrhoid cream if your health care provider approves. If you develop varicose veins, wear support hose. Elevate your feet for 15 minutes, 3-4 times a day. Limit salt in your diet. Avoid heavy lifting, wear low heel shoes, and practice good posture. Rest with your legs elevated if you have leg cramps or low back pain. Visit your dentist if you have not gone yet during your pregnancy. Use a soft toothbrush to brush your teeth  and be gentle when you floss. A sexual relationship may be continued unless your health care provider directs you otherwise. Continue to go to all your prenatal visits as directed by your health care provider. SEEK MEDICAL CARE IF:  You have dizziness. You have mild pelvic cramps, pelvic pressure, or nagging pain in the abdominal area. You have persistent nausea, vomiting, or diarrhea. You have a bad smelling vaginal discharge. You have pain with urination. SEEK IMMEDIATE MEDICAL CARE IF:  You have a fever. You are leaking fluid from your vagina. You have spotting or bleeding from your vagina. You have severe abdominal cramping or pain. You have rapid weight gain or loss. You have shortness of   breath with chest pain. You notice sudden or extreme swelling of your face, hands, ankles, feet, or legs. You have not felt your baby move in over an hour. You have severe headaches that do not go away with medicine. You have vision changes. Document Released: 01/10/2001 Document Revised: 01/21/2013 Document Reviewed: 03/19/2012 [facility] Patient Information 2015 [place], [place]. This information is not intended to replace advice given to you by your health care provider. Make sure you discuss any questions you have with your health care provider.

## 2021-07-05 NOTE — Progress Notes (Signed)
LOW-RISK PREGNANCY VISIT Patient name: Kimberly Kidd MRN [id number]  Date of birth: July 17, 1995 Chief Complaint:   Routine Prenatal Visit  History of Present Illness:   Kimberly Kidd is a 26 y.o. G82P1001 female at [redacted]w[redacted]d with an Estimated Date of Delivery: 11/03/21 being seen today for ongoing management of a low-risk pregnancy.   Today she reports  occ cramping . Contractions: Not present. Vag. Bleeding: None.  Movement: Present. denies leaking of fluid.     04/26/2021    2:23 PM 05/14/2019    1:47 PM 04/11/2018   11:27 AM 03/19/2018    3:22 PM  Depression screen PHQ 2/9  Decreased Interest 0 1 0 0  Down, Depressed, Hopeless 1 1 0 0  PHQ - 2 Score 1 2 0 0  Altered sleeping 0 1 0   Tired, decreased energy 2 0 0   Change in appetite 1 0 0   Feeling bad or failure about yourself  0 0 0   Trouble concentrating 0 0 1   Moving slowly or fidgety/restless 0 0 0   Suicidal thoughts 0 0 0   PHQ-9 Score 4 3 1          04/26/2021    2:26 PM 05/14/2019    1:47 PM  GAD 7 : Generalized Anxiety Score  Nervous, Anxious, on Edge 1 2  Control/stop worrying 0 1  Worry too much - different things 0 1  Trouble relaxing 0 0  Restless 0 0  Easily annoyed or irritable 0 0  Afraid - awful might happen 1 0  Total GAD 7 Score 2 4      Review of Systems:   Pertinent items are noted in HPI Denies abnormal vaginal discharge w/ itching/odor/irritation, headaches, visual changes, shortness of breath, chest pain, abdominal pain, severe nausea/vomiting, or problems with urination or bowel movements unless otherwise stated above. Pertinent History Reviewed:  Reviewed past medical,surgical, social, obstetrical and family history.  Reviewed problem list, medications and allergies. Physical Assessment:   Vitals:   07/05/21 1606  BP: 121/76  Pulse: 89  Weight: 189 lb (85.7 kg)  Body mass index is 33.48 kg/m.        Physical Examination:   General appearance: Well appearing, and in no  distress  Mental status: Alert, oriented to person, place, and time  Skin: Warm & dry  Cardiovascular: Normal heart rate noted  Respiratory: Normal respiratory effort, no distress  Abdomen: Soft, gravid, nontender  Pelvic: Cervical exam deferred         Extremities: Edema: None  Fetal Status: Fetal Heart Rate (bpm): 152 Fundal Height: 23 cm Movement: Present    Chaperone: N/A   No results found for this or any previous visit (from the past 24 hour(s)).  Assessment & Plan:  1) Low-risk pregnancy G2P1001 at [redacted]w[redacted]d with an Estimated Date of Delivery: 11/03/21    Meds: No orders of the defined types were placed in this encounter.  Labs/procedures today: none  Plan:  Continue routine obstetrical care  Next visit: prefers will be in person for pn2     Reviewed: Preterm labor symptoms and general obstetric precautions including but not limited to vaginal bleeding, contractions, leaking of fluid and fetal movement were reviewed in detail with the patient.  All questions were answered. Does have home bp cuff. Office bp cuff given: not applicable. Check bp weekly, let 01/03/22 know if consistently >140 and/or >90.  Follow-up: Return in about 4 weeks (around 08/02/2021) for LROB, PN2,  CNM, in person.  No future appointments.  No orders of the defined types were placed in this encounter.  Cheral Marker CNM, [facility] 07/05/2021 4:29 PM

## 2021-07-21 ENCOUNTER — Telehealth: Payer: Self-pay | Admitting: Obstetrics & Gynecology

## 2021-07-21 NOTE — Telephone Encounter (Signed)
Patient is calling about having a long string of mucus come out. Patient reports increased long back pain. Patient states she is about 6 months pregnancy and 2nd  baby. Patient would like to know if this is normal? Please advise?

## 2021-07-21 NOTE — Telephone Encounter (Signed)
Patient states she lost some mucous last night and is having some slight back pain.  Denies bleeding or leaking of fluid. States she is only drinking about a cup of water daily. Encouraged patient to push more fluids and to monitor back pain.  If it worsened or she developed any bleeding or leaking of fluid, [facility]  Pt verbalized understanding.

## 2021-07-30 DIAGNOSIS — Z419 Encounter for procedure for purposes other than remedying health state, unspecified: Secondary | ICD-10-CM | POA: Diagnosis not present

## 2021-08-03 ENCOUNTER — Encounter: Payer: Medicaid Other | Admitting: Advanced Practice Midwife

## 2021-08-03 ENCOUNTER — Other Ambulatory Visit: Payer: Medicaid Other

## 2021-08-08 ENCOUNTER — Telehealth: Payer: Self-pay | Admitting: Women's Health

## 2021-08-08 NOTE — Telephone Encounter (Signed)
Patient's belly button piercing became infected over the weekend. She wants to know if otc antibiotic cream would work or does she need an oral antibiotics? Please advise.

## 2021-08-11 ENCOUNTER — Encounter: Payer: Self-pay | Admitting: Advanced Practice Midwife

## 2021-08-11 ENCOUNTER — Other Ambulatory Visit: Payer: Medicaid Other

## 2021-08-11 ENCOUNTER — Ambulatory Visit (INDEPENDENT_AMBULATORY_CARE_PROVIDER_SITE_OTHER): Payer: Medicaid Other | Admitting: Advanced Practice Midwife

## 2021-08-11 VITALS — BP 125/78 | HR 82 | Wt 194.0 lb

## 2021-08-11 DIAGNOSIS — Z3483 Encounter for supervision of other normal pregnancy, third trimester: Secondary | ICD-10-CM

## 2021-08-11 DIAGNOSIS — Z23 Encounter for immunization: Secondary | ICD-10-CM | POA: Diagnosis not present

## 2021-08-11 DIAGNOSIS — F332 Major depressive disorder, recurrent severe without psychotic features: Secondary | ICD-10-CM

## 2021-08-11 DIAGNOSIS — Z3480 Encounter for supervision of other normal pregnancy, unspecified trimester: Secondary | ICD-10-CM

## 2021-08-11 DIAGNOSIS — Z3A28 28 weeks gestation of pregnancy: Secondary | ICD-10-CM | POA: Diagnosis not present

## 2021-08-11 NOTE — Patient Instructions (Signed)
Kimberly Kidd, I greatly value your feedback.  If you receive a survey following your visit with [place] today, we appreciate you taking the time to fill it out.  Thanks, Cathie Beams, CNM   [facility] HAS MOVED!!! It is now [facility] ([address] [place], [place] [address]) Entrance located off of [address] Free 24/7 valet parking   Go to [email or web address] to register for FREE online childbirth classes    Call the office [phone]) or go to [facility] if: You begin to have strong, frequent contractions Your water breaks.  Sometimes it is a big gush of fluid, sometimes it is just a trickle that keeps getting your panties wet or running down your legs You have vaginal bleeding.  It is normal to have a small amount of spotting if your cervix was checked.  You don't feel your baby moving like normal.  If you don't, get you something to eat and drink and lay down and focus on feeling your baby move.  You should feel at least 10 movements in 2 hours.  If you don't, you should call the office or go to [facility].    Tdap Vaccine It is recommended that you get the Tdap vaccine during the third trimester of EACH pregnancy to help protect your baby from getting pertussis (whooping cough) 27-36 weeks is the BEST time to do this so that you can pass the protection on to your baby. During pregnancy is better than after pregnancy, but if you are unable to get it during pregnancy it will be offered at the hospital.  You will be offered this vaccine in the office after 27 weeks. If you do not have health insurance, you can get this vaccine at the health department or your family doctor Everyone who will be around your baby should also be up-to-date on their vaccines. Adults (who are not pregnant) only need 1 dose of Tdap during adulthood.   Third Trimester of Pregnancy The third trimester is from week 29 through week 42, months 7 through 9. The third  trimester is a time when the fetus is growing rapidly. At the end of the ninth month, the fetus is about 20 inches in length and weighs 6-10 pounds.  BODY CHANGES Your body goes through many changes during pregnancy. The changes vary from woman to woman.  Your weight will continue to increase. You can expect to gain 25-35 pounds (11-16 kg) by the end of the pregnancy. You may begin to get stretch marks on your hips, abdomen, and breasts. You may urinate more often because the fetus is moving lower into your pelvis and pressing on your bladder. You may develop or continue to have heartburn as a result of your pregnancy. You may develop constipation because certain hormones are causing the muscles that push waste through your intestines to slow down. You may develop hemorrhoids or swollen, bulging veins (varicose veins). You may have pelvic pain because of the weight gain and pregnancy hormones relaxing your joints between the bones in your pelvis. Backaches may result from overexertion of the muscles supporting your posture. You may have changes in your hair. These can include thickening of your hair, rapid growth, and changes in texture. Some women also have hair loss during or after pregnancy, or hair that feels dry or thin. Your hair will most likely return to normal after your baby is born. Your breasts will continue to grow and be tender. A yellow  discharge may leak from your breasts called colostrum. Your belly button may stick out. You may feel short of breath because of your expanding uterus. You may notice the fetus "dropping," or moving lower in your abdomen. You may have a bloody mucus discharge. This usually occurs a few days to a week before labor begins. Your cervix becomes thin and soft (effaced) near your due date. WHAT TO EXPECT AT YOUR PRENATAL EXAMS  You will have prenatal exams every 2 weeks until week 36. Then, you will have weekly prenatal exams. During a routine prenatal  visit: You will be weighed to make sure you and the fetus are growing normally. Your blood pressure is taken. Your abdomen will be measured to track your baby's growth. The fetal heartbeat will be listened to. Any test results from the previous visit will be discussed. You may have a cervical check near your due date to see if you have effaced. At around 36 weeks, your caregiver will check your cervix. At the same time, your caregiver will also perform a test on the secretions of the vaginal tissue. This test is to determine if a type of bacteria, Group B streptococcus, is present. Your caregiver will explain this further. Your caregiver may ask you: What your birth plan is. How you are feeling. If you are feeling the baby move. If you have had any abnormal symptoms, such as leaking fluid, bleeding, severe headaches, or abdominal cramping. If you have any questions. Other tests or screenings that may be performed during your third trimester include: Blood tests that check for low iron levels (anemia). Fetal testing to check the health, activity level, and growth of the fetus. Testing is done if you have certain medical conditions or if there are problems during the pregnancy. FALSE LABOR You may feel small, irregular contractions that eventually go away. These are called Braxton Hicks contractions, or false labor. Contractions may last for hours, days, or even weeks before true labor sets in. If contractions come at regular intervals, intensify, or become painful, it is best to be seen by your caregiver.  SIGNS OF LABOR  Menstrual-like cramps. Contractions that are 5 minutes apart or less. Contractions that start on the top of the uterus and spread down to the lower abdomen and back. A sense of increased pelvic pressure or back pain. A watery or bloody mucus discharge that comes from the vagina. If you have any of these signs before the 37th week of pregnancy, call your caregiver right away.  You need to go to the hospital to get checked immediately. HOME CARE INSTRUCTIONS  Avoid all smoking, herbs, alcohol, and unprescribed drugs. These chemicals affect the formation and growth of the baby. Follow your caregiver's instructions regarding medicine use. There are medicines that are either safe or unsafe to take during pregnancy. Exercise only as directed by your caregiver. Experiencing uterine cramps is a good sign to stop exercising. Continue to eat regular, healthy meals. Wear a good support bra for breast tenderness. Do not use hot tubs, steam rooms, or saunas. Wear your seat belt at all times when driving. Avoid raw meat, uncooked cheese, cat litter boxes, and soil used by cats. These carry germs that can cause birth defects in the baby. Take your prenatal vitamins. Try taking a stool softener (if your caregiver approves) if you develop constipation. Eat more high-fiber foods, such as fresh vegetables or fruit and whole grains. Drink plenty of fluids to keep your urine clear or pale yellow. Take  warm sitz baths to soothe any pain or discomfort caused by hemorrhoids. Use hemorrhoid cream if your caregiver approves. If you develop varicose veins, wear support hose. Elevate your feet for 15 minutes, 3-4 times a day. Limit salt in your diet. Avoid heavy lifting, wear low heal shoes, and practice good posture. Rest a lot with your legs elevated if you have leg cramps or low back pain. Visit your dentist if you have not gone during your pregnancy. Use a soft toothbrush to brush your teeth and be gentle when you floss. A sexual relationship may be continued unless your caregiver directs you otherwise. Do not travel far distances unless it is absolutely necessary and only with the approval of your caregiver. Take prenatal classes to understand, practice, and ask questions about the labor and delivery. Make a trial run to the hospital. Pack your hospital bag. Prepare the baby's  nursery. Continue to go to all your prenatal visits as directed by your caregiver. SEEK MEDICAL CARE IF: You are unsure if you are in labor or if your water has broken. You have dizziness. You have mild pelvic cramps, pelvic pressure, or nagging pain in your abdominal area. You have persistent nausea, vomiting, or diarrhea. You have a bad smelling vaginal discharge. You have pain with urination. SEEK IMMEDIATE MEDICAL CARE IF:  You have a fever. You are leaking fluid from your vagina. You have spotting or bleeding from your vagina. You have severe abdominal cramping or pain. You have rapid weight loss or gain. You have shortness of breath with chest pain. You notice sudden or extreme swelling of your face, hands, ankles, feet, or legs. You have not felt your baby move in over an hour. You have severe headaches that do not go away with medicine. You have vision changes. Document Released: 01/10/2001 Document Revised: 01/21/2013 Document Reviewed: 03/19/2012 [facility] Patient Information 2015 [place], [place]. This information is not intended to replace advice given to you by your health care provider. Make sure you discuss any questions you have with your health care provider.

## 2021-08-11 NOTE — Progress Notes (Signed)
   LOW-RISK PREGNANCY VISIT Patient name: Kimberly Kidd MRN [id number]  Date of birth: July 22, 1995 Chief Complaint:   Routine Prenatal Visit (PN2/Lower back pain)  History of Present Illness:   Kimberly Kidd is a 26 y.o. G70P1001 female at [redacted]w[redacted]d with an Estimated Date of Delivery: 11/03/21 being seen today for ongoing management of a low-risk pregnancy.  Today she reports occ dizziness. Contractions: Not present. Vag. Bleeding: None.  Movement: Present. denies leaking of fluid. Review of Systems:   Pertinent items are noted in HPI Denies abnormal vaginal discharge w/ itching/odor/irritation, headaches, visual changes, shortness of breath, chest pain, abdominal pain, severe nausea/vomiting, or problems with urination or bowel movements unless otherwise stated above. Pertinent History Reviewed:  Reviewed past medical,surgical, social, obstetrical and family history.  Reviewed problem list, medications and allergies. Physical Assessment:   Vitals:   08/11/21 0919  BP: 125/78  Pulse: 82  Weight: 194 lb (88 kg)  Body mass index is 34.37 kg/m.        Physical Examination:   General appearance: Well appearing, and in no distress  Mental status: Alert, oriented to person, place, and time  Skin: Warm & dry  Cardiovascular: Normal heart rate noted  Respiratory: Normal respiratory effort, no distress  Abdomen: Soft, gravid, nontender  Pelvic: Cervical exam deferred         Extremities: Edema: None  Fetal Status: Fetal Heart Rate (bpm): 148 Fundal Height: 28 cm Movement: Present    Chaperone: n/a    No results found for this or any previous visit (from the past 24 hour(s)).  Assessment & Plan:  1) Low-risk pregnancy G2P1001 at [redacted]w[redacted]d with an Estimated Date of Delivery: 11/03/21   2) Dizziness, tips given  3)  Hx major depression:  referral to [facility] to establish care     Meds: No orders of the defined types were placed in this encounter.  Labs/procedures today: PN2  Plan:   Continue routine obstetrical care  Next visit: prefers in person    Reviewed: Preterm labor symptoms and general obstetric precautions including but not limited to vaginal bleeding, contractions, leaking of fluid and fetal movement were reviewed in detail with the patient.  All questions were answered. Has home bp cuff.. Check bp weekly, let us know if >140/90.   Follow-up: Return in about 3 weeks (around 09/01/2021) for LROB.  Orders Placed This Encounter  Procedures   Tdap vaccine greater than or equal to 7yo IM   Ambulatory referral to [facility]   Jacklyn Shell DNP, CNM 08/11/2021 10:32 AM

## 2021-08-12 LAB — CBC
Hematocrit: 39.6 % (ref 34.0–46.6)
Hemoglobin: 13.2 g/dL (ref 11.1–15.9)
MCH: 29 pg (ref 26.6–33.0)
MCHC: 33.3 g/dL (ref 31.5–35.7)
MCV: 87 fL (ref 79–97)
Platelets: 241 10*3/uL (ref 150–450)
RBC: 4.55 x10E6/uL (ref 3.77–5.28)
RDW: 12.1 % (ref 11.7–15.4)
WBC: 8.8 10*3/uL (ref 3.4–10.8)

## 2021-08-12 LAB — RPR: RPR Ser Ql: NONREACTIVE

## 2021-08-12 LAB — GLUCOSE TOLERANCE, 2 HOURS W/ 1HR
Glucose, 1 hour: 108 mg/dL (ref 70–179)
Glucose, 2 hour: 98 mg/dL (ref 70–152)
Glucose, Fasting: 73 mg/dL (ref 70–91)

## 2021-08-12 LAB — HIV ANTIBODY (ROUTINE TESTING W REFLEX): HIV Screen 4th Generation wRfx: NONREACTIVE

## 2021-08-12 LAB — ANTIBODY SCREEN: Antibody Screen: NEGATIVE

## 2021-08-18 ENCOUNTER — Telehealth: Payer: Self-pay | Admitting: Clinical

## 2021-08-18 NOTE — Telephone Encounter (Signed)
Attempt call regarding referral; Unable to leave message as voicemail has not been set up.

## 2021-08-23 NOTE — BH Specialist Note (Signed)
Pt did not arrive to video visit and did not answer the phone; Unable to leave voice message as voicemail is not set up; left MyChart message for patient.

## 2021-08-30 DIAGNOSIS — Z419 Encounter for procedure for purposes other than remedying health state, unspecified: Secondary | ICD-10-CM | POA: Diagnosis not present

## 2021-08-31 ENCOUNTER — Encounter: Payer: Self-pay | Admitting: Emergency Medicine

## 2021-08-31 ENCOUNTER — Other Ambulatory Visit: Payer: Self-pay

## 2021-08-31 ENCOUNTER — Ambulatory Visit
Admission: EM | Admit: 2021-08-31 | Discharge: 2021-08-31 | Disposition: A | Discharge status: Home / Self Care | Payer: Medicaid Other | Attending: Family Medicine | Admitting: Family Medicine

## 2021-08-31 DIAGNOSIS — J209 Acute bronchitis, unspecified: Secondary | ICD-10-CM

## 2021-08-31 DIAGNOSIS — K219 Gastro-esophageal reflux disease without esophagitis: Secondary | ICD-10-CM | POA: Diagnosis not present

## 2021-08-31 MED ORDER — ALBUTEROL SULFATE HFA 108 (90 BASE) MCG/ACT IN AERS: 1.0000 | INHALATION_SPRAY | Freq: Four times a day (QID) | RESPIRATORY_TRACT | 0 refills | Status: AC | PRN

## 2021-08-31 NOTE — Discharge Instructions (Signed)
You may take Flonase nasal spray twice daily, Delsym cough syrup.  Use humidifiers, vapor rubs and drink plenty of fluids.  Follow-up for worsening symptoms.

## 2021-08-31 NOTE — ED Provider Notes (Signed)
[facility]    CSN: [id number] Arrival date & time: 08/31/21  1628      History   Chief Complaint Chief Complaint  Patient presents with   Cough    HPI Kimberly Kidd is a 26 y.o. female.   Presenting today with 1 week history of hacking cough, chest tightness, fatigue, congestion.  Denies fever, chills, body aches, chest pain, abdominal pain, nausea vomiting or diarrhea.  So far not trying anything over-the-counter for symptoms.  Patient is currently third trimester pregnancy.  History of asthma as a child.  Unsure if she has seasonal allergies.    Past Medical History:  Diagnosis Date   Anxiety    Asthma    Hx of eating disorder 11/06/2014   Post traumatic stress disorder (PTSD) 11/06/2014   Social anxiety disorder 11/06/2014   Suicidal ideation 11/06/2014    Patient Active Problem List   Diagnosis Date Noted   Encounter for supervision of normal pregnancy, antepartum 04/26/2021   ASCUS with positive high risk HPV cervical 04/18/2018   MDD (major depressive disorder), recurrent episode, severe (HCC) 11/06/2014   Social anxiety disorder 11/06/2014   Post traumatic stress disorder (PTSD) 11/06/2014   Hx of eating disorder 11/06/2014   Suicidal ideation 11/06/2014   Generalized anxiety disorder 12/23/2010    Past Surgical History:  Procedure Laterality Date   TONSILLECTOMY     tubes in ears      OB History     Gravida  2   Para  1   Term  1   Preterm      AB      Living  1      SAB      IAB      Ectopic      Multiple  0   Live Births  1            Home Medications    Prior to Admission medications   Medication Sig Start Date End Date Taking? Authorizing Provider  albuterol (VENTOLIN HFA) 108 (90 Base) MCG/ACT inhaler Inhale 1-2 puffs into the lungs every 6 (six) hours as needed for wheezing or shortness of breath. 08/31/21  Yes Particia Nearing, PA-C  acetaminophen (TYLENOL) 500 MG tablet Take 500 mg by mouth every 6  (six) hours as needed for mild pain or fever. Patient not taking: Reported on 08/11/2021    [provider]  Blood Pressure Monitor MISC For regular home bp monitoring during pregnancy 04/26/21   Cheral Marker, CNM  Doxylamine-Pyridoxine (DICLEGIS) 10-10 MG TBEC 2 tabs q hs, if sx persist add 1 tab q am on day 3, if sx persist add 1 tab q afternoon on day 4 04/26/21   Cheral Marker, CNM  Prenatal MV & Min w/FA-DHA (PRENATAL ADULT GUMMY/DHA/FA PO) Take by mouth. Takes 2 daily    [provider]    Family History Family History  Problem Relation Age of Onset   Alcohol abuse Maternal Grandfather    Drug abuse Mother    Bipolar disorder Mother    Other Father        murdered   Bipolar disorder Maternal Aunt    Schizophrenia Maternal Uncle    Cancer Paternal Grandfather    Other Maternal Grandmother        open heart surgery   Diabetes Maternal Grandmother     Social History Social History   Tobacco Use   Smoking status: Former    Packs/day: 0.05  Types: Cigarettes   Smokeless tobacco: Former    Types: Snuff  Vaping Use   Vaping Use: Never used  Substance Use Topics   Alcohol use: Not Currently   Drug use: Not Currently    Types: Cocaine     Allergies   Patient has no known allergies.   Review of Systems Review of Systems PER HPI  Physical Exam Triage Vital Signs ED Triage Vitals [08/31/21 1637]  Enc Vitals Group     BP 116/63     Pulse Rate (!) 112     Resp 20     Temp 98.3 F (36.8 C)     Temp Source Oral     SpO2 97 %     Weight      Height      Head Circumference      Peak Flow      Pain Score 7     Pain Loc      Pain Edu?      Excl. in GC?    No data found.  Updated Vital Signs BP 116/63 (BP Location: Right Arm)   Pulse (!) 112   Temp 98.3 F (36.8 C) (Oral)   Resp 20   LMP 12/29/2020 (Approximate)   SpO2 97%   Visual Acuity Right Eye Distance:   Left Eye Distance:   Bilateral Distance:    Right Eye Near:    Left Eye Near:    Bilateral Near:     Physical Exam Vitals and nursing note reviewed.  Constitutional:      Appearance: Normal appearance. She is not ill-appearing.  HENT:     Head: Atraumatic.     Right Ear: Tympanic membrane normal.     Left Ear: Tympanic membrane normal.     Nose: Rhinorrhea present.     Mouth/Throat:     Mouth: Mucous membranes are moist.     Pharynx: No oropharyngeal exudate or posterior oropharyngeal erythema.  Eyes:     Extraocular Movements: Extraocular movements intact.     Conjunctiva/sclera: Conjunctivae normal.  Cardiovascular:     Rate and Rhythm: Normal rate and regular rhythm.     Heart sounds: Normal heart sounds.  Pulmonary:     Effort: Pulmonary effort is normal.     Breath sounds: Normal breath sounds. No wheezing or rales.  Musculoskeletal:        General: Normal range of motion.     Cervical back: Normal range of motion and neck supple.  Skin:    General: Skin is warm and dry.  Neurological:     Mental Status: She is alert and oriented to person, place, and time.  Psychiatric:        Mood and Affect: Mood normal.        Thought Content: Thought content normal.        Judgment: Judgment normal.      UC Treatments / Results  Labs (all labs ordered are listed, but only abnormal results are displayed) Labs Reviewed - No data to display  EKG   Radiology No results found.  Procedures Procedures (including critical care time)  Medications Ordered in UC Medications - No data to display  Initial Impression / Assessment and Plan / UC Course  I have reviewed the triage vital signs and the nursing notes.  Pertinent labs & imaging results that were available during my care of the patient were reviewed by me and considered in my medical decision making (see chart for details).  Suspect viral upper respiratory infection, vitals and exam very reassuring today, discussed safe over-the-counter cold and congestion medications,  albuterol inhaler as needed.  She also notes that she has been having bad reflux her pregnancy the past few weeks.  Tried Tums once or twice but feels like its not helping.  Discussed to continue Tums, diet changes, speak with OB/GYN about if PPIs will be safe for her.  Final Clinical Impressions(s) / UC Diagnoses   Final diagnoses:  Acute bronchitis, unspecified organism  Gastroesophageal reflux disease, unspecified whether esophagitis present     Discharge Instructions      You may take Flonase nasal spray twice daily, Delsym cough syrup.  Use humidifiers, vapor rubs and drink plenty of fluids.  Follow-up for worsening symptoms.    ED Prescriptions     Medication Sig Dispense Auth. Provider   albuterol (VENTOLIN HFA) 108 (90 Base) MCG/ACT inhaler Inhale 1-2 puffs into the lungs every 6 (six) hours as needed for wheezing or shortness of breath. 18 g Particia Nearing, [place]      PDMP not reviewed this encounter.   Particia Nearing, [place] 08/31/21 1737

## 2021-08-31 NOTE — ED Triage Notes (Signed)
Pt reports productive cough and intermittent shortness of breath x1 week. Pt is pregnant and due 10/5.

## 2021-09-01 ENCOUNTER — Ambulatory Visit: Payer: Self-pay

## 2021-09-02 ENCOUNTER — Ambulatory Visit (INDEPENDENT_AMBULATORY_CARE_PROVIDER_SITE_OTHER): Payer: Medicaid Other | Admitting: Advanced Practice Midwife

## 2021-09-02 ENCOUNTER — Encounter: Payer: Self-pay | Admitting: Advanced Practice Midwife

## 2021-09-02 VITALS — BP 109/71 | HR 124 | Wt 201.4 lb

## 2021-09-02 DIAGNOSIS — Z3480 Encounter for supervision of other normal pregnancy, unspecified trimester: Secondary | ICD-10-CM

## 2021-09-02 DIAGNOSIS — Z3A31 31 weeks gestation of pregnancy: Secondary | ICD-10-CM

## 2021-09-02 NOTE — Patient Instructions (Signed)
[place], thank you for choosing our office today! We appreciate the opportunity to meet your healthcare needs. You may receive a short survey by mail, e-mail, or through MyChart. If you are happy with your care we would appreciate if you could take just a few minutes to complete the survey questions. We read all of your comments and take your feedback very seriously. Thank you again for choosing our office.  [facility]  [facility] ([address] [place], [place] [address]) Entrance C, located off of [address] Free 24/7 valet parking   CLASSES: Go to [email or web address] to register for classes (childbirth, breastfeeding, waterbirth, infant CPR, daddy bootcamp, etc.)  Call the office ([phone]) or go to [facility] if: You begin to have strong, frequent contractions Your water breaks.  Sometimes it is a big gush of fluid, sometimes it is just a trickle that keeps getting your panties wet or running down your legs You have vaginal bleeding.  It is normal to have a small amount of spotting if your cervix was checked.  You don't feel your baby moving like normal.  If you don't, get you something to eat and drink and lay down and focus on feeling your baby move.   If your baby is still not moving like normal, you should call the office or go to [facility].  Call the office ([phone]) or go to [facility] for these signs of pre-eclampsia: Severe headache that does not go away with Tylenol Visual changes- seeing spots, double, blurred vision Pain under your right breast or upper abdomen that does not go away with Tums or heartburn medicine Nausea and/or vomiting Severe swelling in your hands, feet, and face   Tdap Vaccine It is recommended that you get the Tdap vaccine during the third trimester of EACH pregnancy to help protect your baby from getting pertussis (whooping cough) 27-36 weeks is the BEST time to do  this so that you can pass the protection on to your baby. During pregnancy is better than after pregnancy, but if you are unable to get it during pregnancy it will be offered at the hospital.  You can get this vaccine with us, at the health department, your family doctor, or some local pharmacies Everyone who will be around your baby should also be up-to-date on their vaccines before the baby comes. Adults (who are not pregnant) only need 1 dose of Tdap during adulthood.   [place] Pediatricians/Family Doctors [facility] ([facility]): [address], [phone]           [facility]: [address], [phone]                [facility] ([facility]): [address], [phone] (call to ask if accepting patients) [facility]: [address], [place], [phone]    [place] Pediatricians/Family Doctors [facility] ([facility]): [address], [phone] [facility]: [address], [phone] [facility]: [address], [phone]  [place] [facility]  [facility] ([facility]): [phone] [facility]: [address], [phone]   [place] Family Doctors [facility]: [address], [phone]  [place] [facility]  [facility]: [address], [phone]  Home Blood Pressure Monitoring for Patients   Your provider has recommended that you check your   blood pressure (BP) at least once a week at home. If you do not have a blood pressure cuff at home, one will be provided for you. Contact your provider if you have not received your monitor within 1 week.   Helpful Tips for Accurate Home Blood Pressure Checks  Don't smoke, exercise, or drink caffeine 30 minutes before checking your BP Use the restroom before checking your BP (a full bladder can raise your  pressure) Relax in a comfortable upright chair Feet on the ground Left arm resting comfortably on a flat surface at the level of your heart Legs uncrossed Back supported Sit quietly and don't talk Place the cuff on your bare arm Adjust snuggly, so that only two fingertips can fit between your skin and the top of the cuff Check 2 readings separated by at least one minute Keep a log of your BP readings For a visual, please reference this diagram: http://ccnc.care/bpdiagram  Provider Name: [facility]     Phone: [phone]  Zone 1: ALL CLEAR  Continue to monitor your symptoms:  BP reading is less than 140 (top number) or less than 90 (bottom number)  No right upper stomach pain No headaches or seeing spots No feeling nauseated or throwing up No swelling in face and hands  Zone 2: CAUTION Call your doctor's office for any of the following:  BP reading is greater than 140 (top number) or greater than 90 (bottom number)  Stomach pain under your ribs in the middle or right side Headaches or seeing spots Feeling nauseated or throwing up Swelling in face and hands  Zone 3: EMERGENCY  Seek immediate medical care if you have any of the following:  BP reading is greater than160 (top number) or greater than 110 (bottom number) Severe headaches not improving with Tylenol Serious difficulty catching your breath Any worsening symptoms from Zone 2   Third Trimester of Pregnancy The third trimester is from week 29 through week 42, months 7 through 9. The third trimester is a time when the fetus is growing rapidly. At the end of the ninth month, the fetus is about 20 inches in length and weighs 6-10 pounds.  BODY CHANGES Your body goes through many changes during pregnancy. The changes vary from woman to woman.  Your weight will continue to increase. You can expect to gain 25-35 pounds (11-16 kg) by the end of the pregnancy. You may begin to get stretch marks on your hips, abdomen,  and breasts. You may urinate more often because the fetus is moving lower into your pelvis and pressing on your bladder. You may develop or continue to have heartburn as a result of your pregnancy. You may develop constipation because certain hormones are causing the muscles that push waste through your intestines to slow down. You may develop hemorrhoids or swollen, bulging veins (varicose veins). You may have pelvic pain because of the weight gain and pregnancy hormones relaxing your joints between the bones in your pelvis. Backaches may result from overexertion of the muscles supporting your posture. You may have changes in your hair. These can include thickening of your hair, rapid growth, and changes in texture. Some women also have hair loss during or after pregnancy, or hair that feels dry or thin. Your hair will most likely return to normal after your baby is born. Your breasts will continue to grow and be tender. A yellow discharge may leak from your breasts called colostrum. Your belly button may stick out. You may   feel short of breath because of your expanding uterus. You may notice the fetus "dropping," or moving lower in your abdomen. You may have a bloody mucus discharge. This usually occurs a few days to a week before labor begins. Your cervix becomes thin and soft (effaced) near your due date. WHAT TO EXPECT AT YOUR PRENATAL EXAMS  You will have prenatal exams every 2 weeks until week 36. Then, you will have weekly prenatal exams. During a routine prenatal visit: You will be weighed to make sure you and the fetus are growing normally. Your blood pressure is taken. Your abdomen will be measured to track your baby's growth. The fetal heartbeat will be listened to. Any test results from the previous visit will be discussed. You may have a cervical check near your due date to see if you have effaced. At around 36 weeks, your caregiver will check your cervix. At the same time, your  caregiver will also perform a test on the secretions of the vaginal tissue. This test is to determine if a type of bacteria, Group B streptococcus, is present. Your caregiver will explain this further. Your caregiver may ask you: What your birth plan is. How you are feeling. If you are feeling the baby move. If you have had any abnormal symptoms, such as leaking fluid, bleeding, severe headaches, or abdominal cramping. If you have any questions. Other tests or screenings that may be performed during your third trimester include: Blood tests that check for low iron levels (anemia). Fetal testing to check the health, activity level, and growth of the fetus. Testing is done if you have certain medical conditions or if there are problems during the pregnancy. FALSE LABOR You may feel small, irregular contractions that eventually go away. These are called Braxton Hicks contractions, or false labor. Contractions may last for hours, days, or even weeks before true labor sets in. If contractions come at regular intervals, intensify, or become painful, it is best to be seen by your caregiver.  SIGNS OF LABOR  Menstrual-like cramps. Contractions that are 5 minutes apart or less. Contractions that start on the top of the uterus and spread down to the lower abdomen and back. A sense of increased pelvic pressure or back pain. A watery or bloody mucus discharge that comes from the vagina. If you have any of these signs before the 37th week of pregnancy, call your caregiver right away. You need to go to the hospital to get checked immediately. HOME CARE INSTRUCTIONS  Avoid all smoking, herbs, alcohol, and unprescribed drugs. These chemicals affect the formation and growth of the baby. Follow your caregiver's instructions regarding medicine use. There are medicines that are either safe or unsafe to take during pregnancy. Exercise only as directed by your caregiver. Experiencing uterine cramps is a good sign to  stop exercising. Continue to eat regular, healthy meals. Wear a good support bra for breast tenderness. Do not use hot tubs, steam rooms, or saunas. Wear your seat belt at all times when driving. Avoid raw meat, uncooked cheese, cat litter boxes, and soil used by cats. These carry germs that can cause birth defects in the baby. Take your prenatal vitamins. Try taking a stool softener (if your caregiver approves) if you develop constipation. Eat more high-fiber foods, such as fresh vegetables or fruit and whole grains. Drink plenty of fluids to keep your urine clear or pale yellow. Take warm sitz baths to soothe any pain or discomfort caused by hemorrhoids. Use hemorrhoid cream if   your caregiver approves. If you develop varicose veins, wear support hose. Elevate your feet for 15 minutes, 3-4 times a day. Limit salt in your diet. Avoid heavy lifting, wear low heal shoes, and practice good posture. Rest a lot with your legs elevated if you have leg cramps or low back pain. Visit your dentist if you have not gone during your pregnancy. Use a soft toothbrush to brush your teeth and be gentle when you floss. A sexual relationship may be continued unless your caregiver directs you otherwise. Do not travel far distances unless it is absolutely necessary and only with the approval of your caregiver. Take prenatal classes to understand, practice, and ask questions about the labor and delivery. Make a trial run to the hospital. Pack your hospital bag. Prepare the baby's nursery. Continue to go to all your prenatal visits as directed by your caregiver. SEEK MEDICAL CARE IF: You are unsure if you are in labor or if your water has broken. You have dizziness. You have mild pelvic cramps, pelvic pressure, or nagging pain in your abdominal area. You have persistent nausea, vomiting, or diarrhea. You have a bad smelling vaginal discharge. You have pain with urination. SEEK IMMEDIATE MEDICAL CARE IF:  You  have a fever. You are leaking fluid from your vagina. You have spotting or bleeding from your vagina. You have severe abdominal cramping or pain. You have rapid weight loss or gain. You have shortness of breath with chest pain. You notice sudden or extreme swelling of your face, hands, ankles, feet, or legs. You have not felt your baby move in over an hour. You have severe headaches that do not go away with medicine. You have vision changes. Document Released: 01/10/2001 Document Revised: 01/21/2013 Document Reviewed: 03/19/2012 [facility] Patient Information 2015 [place], [place]. This information is not intended to replace advice given to you by your health care provider. Make sure you discuss any questions you have with your health care provider.

## 2021-09-02 NOTE — Progress Notes (Signed)
   LOW-RISK PREGNANCY VISIT Patient name: Kimberly Kidd MRN [id number]  Date of birth: 1995/02/11 Chief Complaint:   Routine Prenatal Visit (Seem urgent care 8-2 acute bronchitis)  History of Present Illness:   Kimberly Kidd is a 26 y.o. G62P1001 female at [redacted]w[redacted]d with an Estimated Date of Delivery: 11/03/21 being seen today for ongoing management of a low-risk pregnancy.  Today she reports  having URI symptoms x 1wk- saw UC on 08/31/21 and was given supportive care tips; no fever noted . Contractions: Not present.  .  Movement: Present. denies leaking of fluid. Review of Systems:   Pertinent items are noted in HPI Denies abnormal vaginal discharge w/ itching/odor/irritation, headaches, visual changes, shortness of breath, chest pain, abdominal pain, severe nausea/vomiting, or problems with urination or bowel movements unless otherwise stated above. Pertinent History Reviewed:  Reviewed past medical,surgical, social, obstetrical and family history.  Reviewed problem list, medications and allergies. Physical Assessment:   Vitals:   09/02/21 0912  BP: 109/71  Pulse: (!) 124  Weight: 201 lb 6.4 oz (91.4 kg)  Body mass index is 35.68 kg/m.        Physical Examination:   General appearance: Well appearing, and in no distress  Mental status: Alert, oriented to person, place, and time  Skin: Warm & dry  Cardiovascular: Normal heart rate noted  Respiratory: Normal respiratory effort, no distress  Abdomen: Soft, gravid, nontender  Pelvic: Cervical exam deferred         Extremities: Edema: None  Fetal Status: Fetal Heart Rate (bpm): 134 Fundal Height: 31 cm Movement: Present    No results found for this or any previous visit (from the past 24 hour(s)).  Assessment & Plan:  1) Low-risk pregnancy G2P1001 at [redacted]w[redacted]d with an Estimated Date of Delivery: 11/03/21   2) Viral URI per UC, rec add Mucinex prn   Meds: No orders of the defined types were placed in this encounter.  Labs/procedures  today: none  Plan:  Continue routine obstetrical care   Reviewed: Preterm labor symptoms and general obstetric precautions including but not limited to vaginal bleeding, contractions, leaking of fluid and fetal movement were reviewed in detail with the patient.  All questions were answered. Has home bp cuff. Check bp weekly, let us know if >140/90.   Follow-up: Return in about 2 weeks (around 09/16/2021) for LROB, in person.  No orders of the defined types were placed in this encounter.  Arabella Merles CNM 09/02/2021 9:53 AM

## 2021-09-06 ENCOUNTER — Ambulatory Visit: Payer: Medicaid Other | Admitting: Clinical

## 2021-09-06 DIAGNOSIS — Z91199 Patient's noncompliance with other medical treatment and regimen due to unspecified reason: Secondary | ICD-10-CM

## 2021-09-19 ENCOUNTER — Ambulatory Visit (INDEPENDENT_AMBULATORY_CARE_PROVIDER_SITE_OTHER): Payer: Medicaid Other | Admitting: Women's Health

## 2021-09-19 ENCOUNTER — Encounter: Payer: Self-pay | Admitting: Women's Health

## 2021-09-19 VITALS — BP 126/74 | HR 92 | Wt 205.0 lb

## 2021-09-19 DIAGNOSIS — Z3483 Encounter for supervision of other normal pregnancy, third trimester: Secondary | ICD-10-CM

## 2021-09-19 MED ORDER — CYCLOBENZAPRINE HCL 10 MG PO TABS: 10.0000 mg | ORAL_TABLET | Freq: Three times a day (TID) | ORAL | 0 refills | Status: DC | PRN

## 2021-09-19 NOTE — Patient Instructions (Signed)
Kimberly Kidd, thank you for choosing our office today! We appreciate the opportunity to meet your healthcare needs. You may receive a short survey by mail, e-mail, or through MyChart. If you are happy with your care we would appreciate if you could take just a few minutes to complete the survey questions. We read all of your comments and take your feedback very seriously. Thank you again for choosing our office.  [facility]  [facility] ([address] [place], [place] [address]) Entrance C, located off of [address] Free 24/7 valet parking   CLASSES: Go to [email or web address] to register for classes (childbirth, breastfeeding, waterbirth, infant CPR, daddy bootcamp, etc.)  Call the office ([phone]) or go to [facility] if: You begin to have strong, frequent contractions Your water breaks.  Sometimes it is a big gush of fluid, sometimes it is just a trickle that keeps getting your panties wet or running down your legs You have vaginal bleeding.  It is normal to have a small amount of spotting if your cervix was checked.  You don't feel your baby moving like normal.  If you don't, get you something to eat and drink and lay down and focus on feeling your baby move.   If your baby is still not moving like normal, you should call the office or go to [facility].  Call the office ([phone]) or [facility] to [facility] for these signs of pre-eclampsia: Severe headache that does not go away with Tylenol Visual changes- seeing spots, double, blurred vision Pain under your right breast or upper abdomen that does not go away with Tums or heartburn medicine Nausea and/or vomiting Severe swelling in your hands, feet, and face   Tdap Vaccine It is recommended that you get the Tdap vaccine during the third trimester of EACH pregnancy to help protect your baby from getting pertussis (whooping cough) 27-36 weeks is the BEST time to do  this so that you can pass the protection on to your baby. During pregnancy is better than after pregnancy, but if you are unable to get it during pregnancy it will be offered at the hospital.  You can get this vaccine with us, at the health department, your family doctor, or some local pharmacies Everyone who will be around your baby should also be up-to-date on their vaccines before the baby comes. Adults (who are not pregnant) only need 1 dose of Tdap during adulthood.   [place] Pediatricians/Family Doctors [facility] ([facility]): [address], [phone]           [facility]: [address], [phone]                [facility] ([facility]): [address], [phone] (call to ask if accepting patients) [facility]: [address], [place], [phone]    [place] Pediatricians/Family Doctors [facility] ([facility]): [address], [phone] [facility]: [address], [phone] [facility]: [address], [phone]  [place] [facility]  [facility] ([facility]): [phone] [facility]: [address], [phone]   [place] Family Doctors [facility]: [address], [phone]  [place] [facility]  [facility]: [address], [phone]  Home Blood Pressure Monitoring for Patients   Your provider has recommended that you check your   blood pressure (BP) at least once a week at home. If you do not have a blood pressure cuff at home, one will be provided for you. Contact your provider if you have not received your monitor within 1 week.   Helpful Tips for Accurate Home Blood Pressure Checks  Don't smoke, exercise, or drink caffeine 30 minutes before checking your BP Use the restroom before checking your BP (a full bladder can raise your  pressure) Relax in a comfortable upright chair Feet on the ground Left arm resting comfortably on a flat surface at the level of your heart Legs uncrossed Back supported Sit quietly and don't talk Place the cuff on your bare arm Adjust snuggly, so that only two fingertips can fit between your skin and the top of the cuff Check 2 readings separated by at least one minute Keep a log of your BP readings For a visual, please reference this diagram: http://ccnc.care/bpdiagram  Provider Name: [facility]     Phone: [phone]  Zone 1: ALL CLEAR  Continue to monitor your symptoms:  BP reading is less than 140 (top number) or less than 90 (bottom number)  No right upper stomach pain No headaches or seeing spots No feeling nauseated or throwing up No swelling in face and hands  Zone 2: CAUTION Call your doctor's office for any of the following:  BP reading is greater than 140 (top number) or greater than 90 (bottom number)  Stomach pain under your ribs in the middle or right side Headaches or seeing spots Feeling nauseated or throwing up Swelling in face and hands  Zone 3: EMERGENCY  Seek immediate medical care if you have any of the following:  BP reading is greater than160 (top number) or greater than 110 (bottom number) Severe headaches not improving with Tylenol Serious difficulty catching your breath Any worsening symptoms from Zone 2  Preterm Labor and Birth Information  The normal length of a pregnancy is 39-41 weeks. Preterm labor is when labor starts before 37 completed weeks of pregnancy. What are the risk factors for preterm labor? Preterm labor is more likely to occur in women who: Have certain infections during pregnancy such as a bladder infection, sexually transmitted infection, or infection inside the uterus (chorioamnionitis). Have a shorter-than-normal cervix. Have gone into preterm labor before. Have had surgery on their cervix. Are younger than age 17  or older than age 35. Are African American. Are pregnant with twins or multiple babies (multiple gestation). Take street drugs or smoke while pregnant. Do not gain enough weight while pregnant. Became pregnant shortly after having been pregnant. What are the symptoms of preterm labor? Symptoms of preterm labor include: Cramps similar to those that can happen during a menstrual period. The cramps may happen with diarrhea. Pain in the abdomen or lower back. Regular uterine contractions that may feel like tightening of the abdomen. A feeling of increased pressure in the pelvis. Increased watery or bloody mucus discharge from the vagina. Water breaking (ruptured amniotic sac). Why is it important to recognize signs of preterm labor? It is important to recognize signs of preterm labor because babies who are born prematurely may not be fully developed. This can put them at an increased risk for: Long-term (chronic) heart and lung problems. Difficulty immediately after birth with regulating body systems, including blood sugar, body temperature, heart rate, and breathing rate. Bleeding in the brain. Cerebral palsy. Learning difficulties. Death. These risks are highest for babies who are born before 34 weeks   of pregnancy. How is preterm labor treated? Treatment depends on the length of your pregnancy, your condition, and the health of your baby. It may involve: Having a stitch (suture) placed in your cervix to prevent your cervix from opening too early (cerclage). Taking or being given medicines, such as: Hormone medicines. These may be given early in pregnancy to help support the pregnancy. Medicine to stop contractions. Medicines to help mature the baby's lungs. These may be prescribed if the risk of delivery is high. Medicines to prevent your baby from developing cerebral palsy. If the labor happens before 34 weeks of pregnancy, you may need to stay in the hospital. What should I do if I  think I am in preterm labor? If you think that you are going into preterm labor, call your health care provider right away. How can I prevent preterm labor in future pregnancies? To increase your chance of having a full-term pregnancy: Do not use any tobacco products, such as cigarettes, chewing tobacco, and e-cigarettes. If you need help quitting, ask your health care provider. Do not use street drugs or medicines that have not been prescribed to you during your pregnancy. Talk with your health care provider before taking any herbal supplements, even if you have been taking them regularly. Make sure you gain a healthy amount of weight during your pregnancy. Watch for infection. If you think that you might have an infection, get it checked right away. Make sure to tell your health care provider if you have gone into preterm labor before. This information is not intended to replace advice given to you by your health care provider. Make sure you discuss any questions you have with your health care provider. Document Revised: 05/10/2018 Document Reviewed: 06/09/2015 Elsevier Patient Education  2020 [employer]   

## 2021-09-19 NOTE — Progress Notes (Signed)
LOW-RISK PREGNANCY VISIT Patient name: Kimberly Kidd MRN [id number]  Date of birth: 1995-04-03 Chief Complaint:   Routine Prenatal Visit (Severe back pain)  History of Present Illness:   Kimberly Kidd is a 26 y.o. G79P1001 female at [redacted]w[redacted]d with an Estimated Date of Delivery: 11/03/21 being seen today for ongoing management of a low-risk pregnancy.   Today she reports low back pain x few days, was so bad the other night she couldn't sleep. Contractions: Irritability. Vag. Bleeding: None.  Movement: Present. denies leaking of fluid.     08/11/2021    9:29 AM 04/26/2021    2:23 PM 05/14/2019    1:47 PM 04/11/2018   11:27 AM 03/19/2018    3:22 PM  Depression screen PHQ 2/9  Decreased Interest 2 0 1 0 0  Down, Depressed, Hopeless 1 1 1  0 0  PHQ - 2 Score 3 1 2  0 0  Altered sleeping 0 0 1 0   Tired, decreased energy 2 2 0 0   Change in appetite 0 1 0 0   Feeling bad or failure about yourself  1 0 0 0   Trouble concentrating 0 0 0 1   Moving slowly or fidgety/restless 0 0 0 0   Suicidal thoughts 0 0 0 0   PHQ-9 Score 6 4 3 1          08/11/2021    9:30 AM 04/26/2021    2:26 PM 05/14/2019    1:47 PM  GAD 7 : Generalized Anxiety Score  Nervous, Anxious, on Edge 1 1 2   Control/stop worrying 0 0 1  Worry too much - different things 1 0 1  Trouble relaxing 0 0 0  Restless 0 0 0  Easily annoyed or irritable 1 0 0  Afraid - awful might happen 1 1 0  Total GAD 7 Score 4 2 4       Review of Systems:   Pertinent items are noted in HPI Denies abnormal vaginal discharge w/ itching/odor/irritation, headaches, visual changes, shortness of breath, chest pain, abdominal pain, severe nausea/vomiting, or problems with urination or bowel movements unless otherwise stated above. Pertinent History Reviewed:  Reviewed past medical,surgical, social, obstetrical and family history.  Reviewed problem list, medications and allergies. Physical Assessment:   Vitals:   09/19/21 1448  BP: 126/74   Pulse: 92  Weight: 205 lb (93 kg)  Body mass index is 36.31 kg/m.        Physical Examination:   General appearance: Well appearing, and in no distress  Mental status: Alert, oriented to person, place, and time  Skin: Warm & dry  Cardiovascular: Normal heart rate noted  Respiratory: Normal respiratory effort, no distress  Abdomen: Soft, gravid, nontender  Back: +paraspinous muscle tenderness lower lumbar  Pelvic: Cervical exam deferred         Extremities: Edema: Trace  Fetal Status: Fetal Heart Rate (bpm): 138 Fundal Height: 33 cm Movement: Present    Chaperone: N/A   No results found for this or any previous visit (from the past 24 hour(s)).  Assessment & Plan:  1) Low-risk pregnancy G2P1001 at [redacted]w[redacted]d with an Estimated Date of Delivery: 11/03/21   2) Low back pain/muscle tension, rx flexeril, discussed other relief measures   Meds:  Meds ordered this encounter  Medications   cyclobenzaprine (FLEXERIL) 10 MG tablet    Sig: Take 1 tablet (10 mg total) by mouth every 8 (eight) hours as needed for muscle spasms.    Dispense:  15  tablet    Refill:  0    Order Specific Question:   Supervising Provider    Answer:   Lazaro Arms [[id number]]   Labs/procedures today: none  Plan:  Continue routine obstetrical care  Next visit: prefers in person    Reviewed: Preterm labor symptoms and general obstetric precautions including but not limited to vaginal bleeding, contractions, leaking of fluid and fetal movement were reviewed in detail with the patient.  All questions were answered. Does have home bp cuff. Office bp cuff given: not applicable. Check bp weekly, let us know if consistently >140 and/or >90.  Follow-up: Return in about 2 weeks (around 10/03/2021) for LROB, CNM, in person.  No future appointments.  No orders of the defined types were placed in this encounter.  Cheral Marker CNM, [facility] 09/19/2021 3:04 PM

## 2021-09-30 DIAGNOSIS — Z419 Encounter for procedure for purposes other than remedying health state, unspecified: Secondary | ICD-10-CM | POA: Diagnosis not present

## 2021-10-05 ENCOUNTER — Encounter: Payer: Self-pay | Admitting: Obstetrics & Gynecology

## 2021-10-05 ENCOUNTER — Ambulatory Visit (INDEPENDENT_AMBULATORY_CARE_PROVIDER_SITE_OTHER): Payer: Medicaid Other | Admitting: Obstetrics & Gynecology

## 2021-10-05 VITALS — BP 118/80 | HR 91 | Wt 207.0 lb

## 2021-10-05 DIAGNOSIS — Z3A35 35 weeks gestation of pregnancy: Secondary | ICD-10-CM

## 2021-10-05 DIAGNOSIS — Z3483 Encounter for supervision of other normal pregnancy, third trimester: Secondary | ICD-10-CM

## 2021-10-05 NOTE — Progress Notes (Signed)
   LOW-RISK PREGNANCY VISIT Patient name: Kimberly Kidd MRN [id number]  Date of birth: 11-21-1995 Chief Complaint:   Routine Prenatal Visit  History of Present Illness:   Kimberly Kidd is a 26 y.o. G8P1001 female at [redacted]w[redacted]d with an Estimated Date of Delivery: 11/03/21 being seen today for ongoing management of a low-risk pregnancy.      08/11/2021    9:29 AM 04/26/2021    2:23 PM 05/14/2019    1:47 PM 04/11/2018   11:27 AM 03/19/2018    3:22 PM  Depression screen PHQ 2/9  Decreased Interest 2 0 1 0 0  Down, Depressed, Hopeless 1 1 1  0 0  PHQ - 2 Score 3 1 2  0 0  Altered sleeping 0 0 1 0   Tired, decreased energy 2 2 0 0   Change in appetite 0 1 0 0   Feeling bad or failure about yourself  1 0 0 0   Trouble concentrating 0 0 0 1   Moving slowly or fidgety/restless 0 0 0 0   Suicidal thoughts 0 0 0 0   PHQ-9 Score 6 4 3 1      Today she reports  increased white discharge- does not feel like her water is broken.  No odor or itching . Contractions: Irritability. Vag. Bleeding: None.  Movement: Present. denies leaking of fluid. Review of Systems:   Pertinent items are noted in HPI Denies headaches, visual changes, shortness of breath, chest pain, abdominal pain, severe nausea/vomiting, or problems with urination or bowel movements unless otherwise stated above. Pertinent History Reviewed:  Reviewed past medical,surgical, social, obstetrical and family history.  Reviewed problem list, medications and allergies.  Physical Assessment:   Vitals:   10/05/21 1529  BP: 118/80  Pulse: 91  Weight: 207 lb (93.9 kg)  Body mass index is 36.67 kg/m.        Physical Examination:   General appearance: Well appearing, and in no distress  Mental status: Alert, oriented to person, place, and time  Skin: Warm & dry  Respiratory: Normal respiratory effort, no distress  Abdomen: Soft, gravid, nontender  Pelvic:  pt declined          Extremities: Edema: Trace  Psych:  mood and affect  appropriate  Fetal Status: Fetal Heart Rate (bpm): 135 Fundal Height: 36 cm Movement: Present    Chaperone: n/a    No results found for this or any previous visit (from the past 24 hour(s)).   Assessment & Plan:  1) Low-risk pregnancy G2P1001 at [redacted]w[redacted]d with an Estimated Date of Delivery: 11/03/21   2) Vaginal discharge -discussed vaginitis- pt declined testing today, if still present will consider testing next visit   Meds: No orders of the defined types were placed in this encounter.  Labs/procedures today: none  Plan:  Continue routine obstetrical care  Next visit: prefers in person    Reviewed: Preterm labor symptoms and general obstetric precautions including but not limited to vaginal bleeding, contractions, leaking of fluid and fetal movement were reviewed in detail with the patient.  All questions were answered.    Follow-up: Return in about 1 week (around 10/12/2021) for LROB visit.  No orders of the defined types were placed in this encounter.   [redacted]w[redacted]d, DO Attending Obstetrician & Gynecologist, [facility] for 10/14/2021, [facility]

## 2021-10-12 ENCOUNTER — Other Ambulatory Visit (HOSPITAL_COMMUNITY)
Admission: RE | Admit: 2021-10-12 | Discharge: 2021-10-12 | Disposition: A | Discharge status: Home / Self Care | Payer: Medicaid Other | Source: Ambulatory Visit | Attending: Advanced Practice Midwife | Admitting: Advanced Practice Midwife

## 2021-10-12 ENCOUNTER — Ambulatory Visit (INDEPENDENT_AMBULATORY_CARE_PROVIDER_SITE_OTHER): Payer: Medicaid Other | Admitting: Advanced Practice Midwife

## 2021-10-12 ENCOUNTER — Encounter: Payer: Self-pay | Admitting: Advanced Practice Midwife

## 2021-10-12 VITALS — BP 121/80 | HR 82 | Wt 211.0 lb

## 2021-10-12 DIAGNOSIS — Z3480 Encounter for supervision of other normal pregnancy, unspecified trimester: Secondary | ICD-10-CM | POA: Insufficient documentation

## 2021-10-12 DIAGNOSIS — Z3A36 36 weeks gestation of pregnancy: Secondary | ICD-10-CM | POA: Diagnosis not present

## 2021-10-12 NOTE — Progress Notes (Signed)
   LOW-RISK PREGNANCY VISIT Patient name: Kimberly Kidd MRN [id number]  Date of birth: 31-May-1995 Chief Complaint:   Routine Prenatal Visit  History of Present Illness:   Kimberly Kidd is a 26 y.o. G34P1001 female at [redacted]w[redacted]d with an Estimated Date of Delivery: 11/03/21 being seen today for ongoing management of a low-risk pregnancy.  Today she reports  some spotting in panties upon arrival; irreg ctx . Contractions: Irritability. Vag. Bleeding: Scant.  Movement: Present. denies leaking of fluid. Review of Systems:   Pertinent items are noted in HPI Denies abnormal vaginal discharge w/ itching/odor/irritation, headaches, visual changes, shortness of breath, chest pain, abdominal pain, severe nausea/vomiting, or problems with urination or bowel movements unless otherwise stated above. Pertinent History Reviewed:  Reviewed past medical,surgical, social, obstetrical and family history.  Reviewed problem list, medications and allergies. Physical Assessment:   Vitals:   10/12/21 1339  BP: 121/80  Pulse: 82  Weight: 211 lb (95.7 kg)  Body mass index is 37.38 kg/m.        Physical Examination:   General appearance: Well appearing, and in no distress  Mental status: Alert, oriented to person, place, and time  Skin: Warm & dry  Cardiovascular: Normal heart rate noted  Respiratory: Normal respiratory effort, no distress  Abdomen: Soft, gravid, nontender  Pelvic: Cervical exam performed  Dilation: Fingertip Effacement (%): 50 Station: -2  Extremities: Edema: Mild pitting, slight indentation  Fetal Status: Fetal Heart Rate (bpm): 158 Fundal Height: 37 cm Movement: Present Presentation: Vertex  No results found for this or any previous visit (from the past 24 hour(s)).  Assessment & Plan:  1) Low-risk pregnancy G2P1001 at [redacted]w[redacted]d with an Estimated Date of Delivery: 11/03/21   2) Small spotting today, rev'd could be labor starting; given labor and bleeding precautions (no blood seen on swabs  or glove after exam)   Meds: No orders of the defined types were placed in this encounter.  Labs/procedures today: GBS/cultures  Plan:  Continue routine obstetrical care    Reviewed: Term labor symptoms and general obstetric precautions including but not limited to vaginal bleeding, contractions, leaking of fluid and fetal movement were reviewed in detail with the patient.  All questions were answered. Didn't ask about home bp cuff. Check bp weekly, let us know if >140/90.   Follow-up: Return for As scheduled.  Orders Placed This Encounter  Procedures   Culture, beta strep (group b only)   Arabella [facility] 10/12/2021 1:53 PM

## 2021-10-14 LAB — CERVICOVAGINAL ANCILLARY ONLY
Chlamydia: NEGATIVE
Comment: NEGATIVE
Comment: NORMAL
Neisseria Gonorrhea: NEGATIVE

## 2021-10-16 LAB — CULTURE, BETA STREP (GROUP B ONLY): Strep Gp B Culture: NEGATIVE

## 2021-10-19 ENCOUNTER — Ambulatory Visit (INDEPENDENT_AMBULATORY_CARE_PROVIDER_SITE_OTHER): Payer: Medicaid Other | Admitting: Advanced Practice Midwife

## 2021-10-19 VITALS — BP 123/81 | HR 82 | Wt 212.0 lb

## 2021-10-19 DIAGNOSIS — Z3A37 37 weeks gestation of pregnancy: Secondary | ICD-10-CM

## 2021-10-19 NOTE — Progress Notes (Signed)
   LOW-RISK PREGNANCY VISIT Patient name: Kimberly Kidd MRN [id number]  Date of birth: [birth year] Chief Complaint:   Routine Prenatal Visit  History of Present Illness:   Kimberly Kidd is a 26 y.o. G77P1001 female at [redacted]w[redacted]d with an Estimated Date of Delivery: 11/03/21 being seen today for ongoing management of a low-risk pregnancy.  Today she reports no complaints. Contractions: Irritability. Vag. Bleeding: None.  Movement: Present. denies leaking of fluid. Review of Systems:   Pertinent items are noted in HPI Denies abnormal vaginal discharge w/ itching/odor/irritation, headaches, visual changes, shortness of breath, chest pain, abdominal pain, severe nausea/vomiting, or problems with urination or bowel movements unless otherwise stated above. Pertinent History Reviewed:  Reviewed past medical,surgical, social, obstetrical and family history.  Reviewed problem list, medications and allergies. Physical Assessment:   Vitals:   10/19/21 1349  BP: 123/81  Pulse: 82  Weight: 212 lb (96.2 kg)  Body mass index is 37.55 kg/m.        Physical Examination:   General appearance: Well appearing, and in no distress  Mental status: Alert, oriented to person, place, and time  Skin: Warm & dry  Cardiovascular: Normal heart rate noted  Respiratory: Normal respiratory effort, no distress  Abdomen: Soft, gravid, nontender  Pelvic: Cervical exam performed  Dilation: 2 Effacement (%): Thick Station: -2  Extremities: Edema: Trace  Fetal Status: Fetal Heart Rate (bpm): 155 Fundal Height: 37 cm Movement: Present Presentation: Vertex  No results found for this or any previous visit (from the past 24 hour(s)).  Assessment & Plan:  1) Low-risk pregnancy G2P1001 at [redacted]w[redacted]d with an Estimated Date of Delivery: 11/03/21     Meds: No orders of the defined types were placed in this encounter.  Labs/procedures today: SVE  Plan:  Continue routine obstetrical care   Reviewed: Term labor symptoms and  general obstetric precautions including but not limited to vaginal bleeding, contractions, leaking of fluid and fetal movement were reviewed in detail with the patient.  All questions were answered. Has home bp cuff.  Check bp weekly, let us know if >140/90.   Follow-up: Return for As scheduled.  No orders of the defined types were placed in this encounter.  Myrtis Ser CNM 10/19/2021 2:24 PM

## 2021-10-19 NOTE — Patient Instructions (Signed)
Try the positions on [email or web address] 

## 2021-10-24 ENCOUNTER — Encounter (HOSPITAL_COMMUNITY): Payer: Self-pay | Admitting: Obstetrics & Gynecology

## 2021-10-24 ENCOUNTER — Inpatient Hospital Stay (HOSPITAL_COMMUNITY)
Admission: AD | Admit: 2021-10-24 | Discharge: 2021-10-25 | Disposition: A | Discharge status: Home / Self Care | Payer: Medicaid Other | Attending: Obstetrics & Gynecology | Admitting: Obstetrics & Gynecology

## 2021-10-24 DIAGNOSIS — Z0371 Encounter for suspected problem with amniotic cavity and membrane ruled out: Secondary | ICD-10-CM

## 2021-10-24 DIAGNOSIS — O471 False labor at or after 37 completed weeks of gestation: Secondary | ICD-10-CM | POA: Insufficient documentation

## 2021-10-24 DIAGNOSIS — Z3A38 38 weeks gestation of pregnancy: Secondary | ICD-10-CM

## 2021-10-24 DIAGNOSIS — Z3689 Encounter for other specified antenatal screening: Secondary | ICD-10-CM

## 2021-10-24 NOTE — MAU Note (Signed)
.  Kimberly Kidd is a 26 y.o. at [redacted]w[redacted]d here in MAU reporting: possible LOF since 1600 today with clear fluid, leaking since stopped but pt reports feeling wet. Pt report CTX since 1900 with constant vaginal pressure. Pt reports some decreased FM for the last day, none in the last hour. Pt denies VB, PIH s/s, recent intercourse, and complications in the pregnancy.   Onset of complaint: 1600 Pain score: 5/10 Vitals:   10/24/21 2236  BP: 130/79  Pulse: 94  Resp: 20  Temp: 98 F (36.7 C)  SpO2: 99%     FHT:149 Lab orders placed from triage:

## 2021-10-24 NOTE — MAU Provider Note (Signed)
None      S: Ms. Cameo Schmiesing is a 26 y.o. G2P1001 at [redacted]w[redacted]d  who presents to MAU today complaining of leaking of fluid since 1600. She denies vaginal bleeding. She endorses contractions. She reports normal fetal movement.    O: BP 121/77   Pulse (!) 103   Temp 98 F (36.7 C) (Oral)   Resp 20   Ht 5\' 3"  (1.6 m)   Wt 97.6 kg   LMP 12/29/2020 (Approximate)   SpO2 97%   BMI 38.12 kg/m  GENERAL: Well-developed, well-nourished female in no acute distress.  HEAD: Normocephalic, atraumatic.  CHEST: Normal effort of breathing, regular heart rate ABDOMEN: Soft, nontender, gravid PELVIC: Normal external female genitalia. Vagina is pink and rugated. Cervix with normal contour, no lesions. Normal discharge.  Negative pooling. Fern Collected  Cervical exam:  Dilation: 2 Effacement (%): 50 Cervical Position: Middle Station: -2 Presentation: Vertex Exam by:: Huston Foley RN   Fetal Monitoring: FHT: 130 bpm, Mod Var, -Decels, +Accels Toco: Irregular  No results found for this or any previous visit (from the past 24 hour(s)).   A: SIUP at [redacted]w[redacted]d  Membranes intact Cat I FT  P: Fern negative. NST reactive. Cervical exam remains unchanged.  Instructed to keep next appt as scheduled. Nurse to give precautions.   Gavin Pound, CNM 10/25/2021 12:00 AM

## 2021-10-24 NOTE — MAU Provider Note (Incomplete)
None      S: Ms. Kimberly Kidd is a 26 y.o. G2P1001 at [redacted]w[redacted]d  who presents to MAU today complaining of leaking of fluid since 1600. She denies vaginal bleeding. She endorses contractions. She reports normal fetal movement.    O: BP 121/77   Pulse (!) 103   Temp 98 F (36.7 C) (Oral)   Resp 20   Ht 5\' 3"  (1.6 m)   Wt 97.6 kg   LMP 12/29/2020 (Approximate)   SpO2 97%   BMI 38.12 kg/m  GENERAL: Well-developed, well-nourished female in no acute distress.  HEAD: Normocephalic, atraumatic.  CHEST: Normal effort of breathing, regular heart rate ABDOMEN: Soft, nontender, gravid PELVIC: Normal external female genitalia. Vagina is pink and rugated. Cervix with normal contour, no lesions. Normal discharge.  Negative pooling. Fern Collected  Cervical exam:  Dilation: 2 Effacement (%): 50 Cervical Position: Middle Station: -2 Presentation: Vertex Exam by:: Huston Foley RN   Fetal Monitoring: FHT: Toco: No ctx graphed  No results found for this or any previous visit (from the past 24 hour(s)).   A: SIUP at [redacted]w[redacted]d  {Blank single:19197::"Membranes intact","SROM"}  P: {Blank single:19197}  Gavin Pound, CNM 10/25/2021 12:00 AM

## 2021-10-25 DIAGNOSIS — Z0371 Encounter for suspected problem with amniotic cavity and membrane ruled out: Secondary | ICD-10-CM

## 2021-10-25 DIAGNOSIS — O471 False labor at or after 37 completed weeks of gestation: Secondary | ICD-10-CM | POA: Diagnosis not present

## 2021-10-25 DIAGNOSIS — Z3A38 38 weeks gestation of pregnancy: Secondary | ICD-10-CM | POA: Diagnosis not present

## 2021-10-25 LAB — POCT FERN TEST: POCT Fern Test: NEGATIVE

## 2021-10-25 NOTE — MAU Provider Note (Addendum)
Ms. Norvella Loscalzo is a G2P1001 at [redacted]w[redacted]d seen in MAU for labor. RN labor check, not seen by provider. SVE by RN Dilation: 2 Effacement (%): 50 Cervical Position: Middle Station: -2 Presentation: Vertex Exam by:: Huston Foley RN   NST - FHR: 115 bpm / moderate variability / accels present / decels absent / TOCO: irregular intermittent  Plan:  D/C home with labor precautions Keep scheduled appt on 10/25/21 with Charlann Noss, MD  10/25/2021 12:09 AM

## 2021-10-26 ENCOUNTER — Encounter: Payer: Self-pay | Admitting: Advanced Practice Midwife

## 2021-10-26 ENCOUNTER — Ambulatory Visit (INDEPENDENT_AMBULATORY_CARE_PROVIDER_SITE_OTHER): Payer: Medicaid Other | Admitting: Advanced Practice Midwife

## 2021-10-26 VITALS — BP 118/78 | HR 94 | Wt 214.0 lb

## 2021-10-26 DIAGNOSIS — Z3A38 38 weeks gestation of pregnancy: Secondary | ICD-10-CM

## 2021-10-26 NOTE — Patient Instructions (Signed)
Try the [email or web address] website!

## 2021-10-26 NOTE — Progress Notes (Signed)
   LOW-RISK PREGNANCY VISIT Patient name: Kimberly Kidd MRN [id number]  Date of birth: 27-Mar-1995 Chief Complaint:   Routine Prenatal Visit  History of Present Illness:   Kimberly Kidd is a 26 y.o. G58P1001 female at [redacted]w[redacted]d with an Estimated Date of Delivery: 11/03/21 being seen today for ongoing management of a low-risk pregnancy.  Today she reports  having some bouts of ctx and went to MAU without cx change . Contractions: Irritability. Vag. Bleeding: None.  Movement: Present. denies leaking of fluid. Review of Systems:   Pertinent items are noted in HPI Denies abnormal vaginal discharge w/ itching/odor/irritation, headaches, visual changes, shortness of breath, chest pain, abdominal pain, severe nausea/vomiting, or problems with urination or bowel movements unless otherwise stated above. Pertinent History Reviewed:  Reviewed past medical,surgical, social, obstetrical and family history.  Reviewed problem list, medications and allergies. Physical Assessment:   Vitals:   10/26/21 1443  BP: 118/78  Pulse: 94  Weight: 214 lb (97.1 kg)  Body mass index is 37.91 kg/m.        Physical Examination:   General appearance: Well appearing, and in no distress  Mental status: Alert, oriented to person, place, and time  Skin: Warm & dry  Cardiovascular: Normal heart rate noted  Respiratory: Normal respiratory effort, no distress  Abdomen: Soft, gravid, nontender  Pelvic: Cervical exam deferred         Extremities: Edema: None  Fetal Status: Fetal Heart Rate (bpm): 130 Fundal Height: 38 cm Movement: Present Presentation: Vertex  No results found for this or any previous visit (from the past 24 hour(s)).  Assessment & Plan:  1) Low-risk pregnancy G2P1001 at [redacted]w[redacted]d with an Estimated Date of Delivery: 11/03/21   2) Approaching EDC, add NST to next visit, and will plan PD IOL for 41 wks prn   Meds: No orders of the defined types were placed in this encounter.  Labs/procedures today:  none  Plan:  Continue routine obstetrical care   Reviewed: Term labor symptoms and general obstetric precautions including but not limited to vaginal bleeding, contractions, leaking of fluid and fetal movement were reviewed in detail with the patient.  All questions were answered. Has home bp cuff. Check bp weekly, let us know if >140/90.   Follow-up: Return for change 10/4 appt to 10/6 LROB with NST (or 10/5 if you don't have 10/6 openings).  No orders of the defined types were placed in this encounter.  Myrtis Ser CNM 10/26/2021 3:09 PM

## 2021-10-29 ENCOUNTER — Inpatient Hospital Stay (HOSPITAL_COMMUNITY): Payer: Medicaid Other | Admitting: Anesthesiology

## 2021-10-29 ENCOUNTER — Other Ambulatory Visit: Payer: Self-pay

## 2021-10-29 ENCOUNTER — Encounter (HOSPITAL_COMMUNITY): Payer: Self-pay | Admitting: Obstetrics & Gynecology

## 2021-10-29 ENCOUNTER — Inpatient Hospital Stay (HOSPITAL_COMMUNITY)
Admission: AD | Admit: 2021-10-29 | Discharge: 2021-10-30 | DRG: 806 | Disposition: A | Discharge status: Home / Self Care | Payer: Medicaid Other | Attending: Obstetrics and Gynecology | Admitting: Obstetrics and Gynecology

## 2021-10-29 DIAGNOSIS — F332 Major depressive disorder, recurrent severe without psychotic features: Secondary | ICD-10-CM | POA: Diagnosis not present

## 2021-10-29 DIAGNOSIS — O26893 Other specified pregnancy related conditions, third trimester: Secondary | ICD-10-CM | POA: Diagnosis not present

## 2021-10-29 DIAGNOSIS — J45909 Unspecified asthma, uncomplicated: Secondary | ICD-10-CM | POA: Diagnosis present

## 2021-10-29 DIAGNOSIS — Z23 Encounter for immunization: Secondary | ICD-10-CM

## 2021-10-29 DIAGNOSIS — Z87891 Personal history of nicotine dependence: Secondary | ICD-10-CM | POA: Diagnosis not present

## 2021-10-29 DIAGNOSIS — Z3A39 39 weeks gestation of pregnancy: Secondary | ICD-10-CM | POA: Diagnosis not present

## 2021-10-29 DIAGNOSIS — O9952 Diseases of the respiratory system complicating childbirth: Secondary | ICD-10-CM | POA: Diagnosis not present

## 2021-10-29 DIAGNOSIS — Z3490 Encounter for supervision of normal pregnancy, unspecified, unspecified trimester: Secondary | ICD-10-CM

## 2021-10-29 DIAGNOSIS — Z3480 Encounter for supervision of other normal pregnancy, unspecified trimester: Principal | ICD-10-CM

## 2021-10-29 LAB — TYPE AND SCREEN
ABO/RH(D): A POS
Antibody Screen: NEGATIVE

## 2021-10-29 LAB — CBC
HCT: 34.8 % — ABNORMAL LOW (ref 36.0–46.0)
Hemoglobin: 11.7 g/dL — ABNORMAL LOW (ref 12.0–15.0)
MCH: 28.4 pg (ref 26.0–34.0)
MCHC: 33.6 g/dL (ref 30.0–36.0)
MCV: 84.5 fL (ref 80.0–100.0)
Platelets: 215 10*3/uL (ref 150–400)
RBC: 4.12 MIL/uL (ref 3.87–5.11)
RDW: 13.2 % (ref 11.5–15.5)
WBC: 13.2 10*3/uL — ABNORMAL HIGH (ref 4.0–10.5)
nRBC: 0 % (ref 0.0–0.2)

## 2021-10-29 LAB — RPR: RPR Ser Ql: NONREACTIVE

## 2021-10-29 MED ORDER — LACTATED RINGERS IV BOLUS
1000.0000 mL | Freq: Once | INTRAVENOUS | Status: AC
  Administered 2021-10-29: 1000 mL via INTRAVENOUS

## 2021-10-29 MED ORDER — COCONUT OIL OIL: 1.0000 | TOPICAL_OIL | Status: DC | PRN

## 2021-10-29 MED ORDER — EPHEDRINE 5 MG/ML INJ: 10.0000 mg | INTRAVENOUS | Status: DC | PRN

## 2021-10-29 MED ORDER — DIPHENHYDRAMINE HCL 50 MG/ML IJ SOLN: 12.5000 mg | INTRAMUSCULAR | Status: DC | PRN

## 2021-10-29 MED ORDER — SIMETHICONE 80 MG PO CHEW: 80.0000 mg | CHEWABLE_TABLET | ORAL | Status: DC | PRN

## 2021-10-29 MED ORDER — PHENYLEPHRINE 80 MCG/ML (10ML) SYRINGE FOR IV PUSH (FOR BLOOD PRESSURE SUPPORT): 80.0000 ug | PREFILLED_SYRINGE | INTRAVENOUS | Status: DC | PRN

## 2021-10-29 MED ORDER — DIPHENHYDRAMINE HCL 25 MG PO CAPS: 25.0000 mg | ORAL_CAPSULE | Freq: Four times a day (QID) | ORAL | Status: DC | PRN

## 2021-10-29 MED ORDER — FENTANYL CITRATE (PF) 100 MCG/2ML IJ SOLN: 100.0000 ug | Freq: Once | INTRAMUSCULAR | Status: DC

## 2021-10-29 MED ORDER — LIDOCAINE HCL (PF) 1 % IJ SOLN: 30.0000 mL | INTRAMUSCULAR | Status: DC | PRN

## 2021-10-29 MED ORDER — LIDOCAINE HCL (PF) 1 % IJ SOLN
INTRAMUSCULAR | Status: DC | PRN
  Administered 2021-10-29: 6 mL via EPIDURAL
  Administered 2021-10-29: 4 mL via EPIDURAL

## 2021-10-29 MED ORDER — LACTATED RINGERS IV SOLN: 500.0000 mL | Freq: Once | INTRAVENOUS | Status: DC

## 2021-10-29 MED ORDER — SODIUM CHLORIDE 0.9% FLUSH: 3.0000 mL | INTRAVENOUS | Status: DC | PRN

## 2021-10-29 MED ORDER — OXYTOCIN-SODIUM CHLORIDE 30-0.9 UT/500ML-% IV SOLN: 2.5000 [IU]/h | INTRAVENOUS | Status: DC

## 2021-10-29 MED ORDER — ACETAMINOPHEN 325 MG PO TABS
650.0000 mg | ORAL_TABLET | ORAL | Status: DC | PRN
  Administered 2021-10-30: 650 mg via ORAL
  Filled 2021-10-29: qty 2

## 2021-10-29 MED ORDER — ZOLPIDEM TARTRATE 5 MG PO TABS: 5.0000 mg | ORAL_TABLET | Freq: Every evening | ORAL | Status: DC | PRN

## 2021-10-29 MED ORDER — OXYTOCIN-SODIUM CHLORIDE 30-0.9 UT/500ML-% IV SOLN
1.0000 m[IU]/min | INTRAVENOUS | Status: DC
  Administered 2021-10-29: 2 m[IU]/min via INTRAVENOUS
  Filled 2021-10-29: qty 500

## 2021-10-29 MED ORDER — LACTATED RINGERS IV SOLN: INTRAVENOUS | Status: DC

## 2021-10-29 MED ORDER — SODIUM CHLORIDE 0.9% FLUSH
3.0000 mL | Freq: Two times a day (BID) | INTRAVENOUS | Status: DC
  Administered 2021-10-30: 3 mL via INTRAVENOUS

## 2021-10-29 MED ORDER — SODIUM CHLORIDE 0.9 % IV SOLN: INTRAVENOUS | Status: DC | PRN

## 2021-10-29 MED ORDER — ONDANSETRON HCL 4 MG/2ML IJ SOLN
4.0000 mg | Freq: Four times a day (QID) | INTRAMUSCULAR | Status: DC | PRN
  Administered 2021-10-29: 4 mg via INTRAVENOUS
  Filled 2021-10-29: qty 2

## 2021-10-29 MED ORDER — BENZOCAINE-MENTHOL 20-0.5 % EX AERO
1.0000 | INHALATION_SPRAY | CUTANEOUS | Status: DC | PRN
  Administered 2021-10-29: 1 via TOPICAL
  Filled 2021-10-29: qty 56

## 2021-10-29 MED ORDER — DIBUCAINE (PERIANAL) 1 % EX OINT: 1.0000 | TOPICAL_OINTMENT | CUTANEOUS | Status: DC | PRN

## 2021-10-29 MED ORDER — ONDANSETRON HCL 4 MG/2ML IJ SOLN: 4.0000 mg | INTRAMUSCULAR | Status: DC | PRN

## 2021-10-29 MED ORDER — OXYTOCIN BOLUS FROM INFUSION
333.0000 mL | Freq: Once | INTRAVENOUS | Status: AC
  Administered 2021-10-29: 333 mL via INTRAVENOUS

## 2021-10-29 MED ORDER — OXYCODONE-ACETAMINOPHEN 5-325 MG PO TABS: 1.0000 | ORAL_TABLET | ORAL | Status: DC | PRN

## 2021-10-29 MED ORDER — FENTANYL-BUPIVACAINE-NACL 0.5-0.125-0.9 MG/250ML-% EP SOLN
12.0000 mL/h | EPIDURAL | Status: DC | PRN
  Administered 2021-10-29: 12 mL/h via EPIDURAL
  Filled 2021-10-29: qty 250

## 2021-10-29 MED ORDER — LACTATED RINGERS IV SOLN
500.0000 mL | INTRAVENOUS | Status: DC | PRN
  Administered 2021-10-29: 500 mL via INTRAVENOUS

## 2021-10-29 MED ORDER — WITCH HAZEL-GLYCERIN EX PADS: 1.0000 | MEDICATED_PAD | CUTANEOUS | Status: DC | PRN

## 2021-10-29 MED ORDER — ONDANSETRON HCL 4 MG PO TABS: 4.0000 mg | ORAL_TABLET | ORAL | Status: DC | PRN

## 2021-10-29 MED ORDER — OXYCODONE-ACETAMINOPHEN 5-325 MG PO TABS: 2.0000 | ORAL_TABLET | ORAL | Status: DC | PRN

## 2021-10-29 MED ORDER — ACETAMINOPHEN 325 MG PO TABS: 650.0000 mg | ORAL_TABLET | ORAL | Status: DC | PRN

## 2021-10-29 MED ORDER — SOD CITRATE-CITRIC ACID 500-334 MG/5ML PO SOLN: 30.0000 mL | ORAL | Status: DC | PRN

## 2021-10-29 MED ORDER — SENNOSIDES-DOCUSATE SODIUM 8.6-50 MG PO TABS: 2.0000 | ORAL_TABLET | ORAL | Status: DC

## 2021-10-29 MED ORDER — PRENATAL MULTIVITAMIN CH
1.0000 | ORAL_TABLET | Freq: Every day | ORAL | Status: DC
  Administered 2021-10-30: 1 via ORAL
  Filled 2021-10-29: qty 1

## 2021-10-29 MED ORDER — IBUPROFEN 600 MG PO TABS
600.0000 mg | ORAL_TABLET | Freq: Four times a day (QID) | ORAL | Status: DC
  Administered 2021-10-29 – 2021-10-30 (×4): 600 mg via ORAL
  Filled 2021-10-29 (×4): qty 1

## 2021-10-29 MED ORDER — TERBUTALINE SULFATE 1 MG/ML IJ SOLN: 0.2500 mg | Freq: Once | INTRAMUSCULAR | Status: DC | PRN

## 2021-10-29 NOTE — Anesthesia Preprocedure Evaluation (Signed)
Anesthesia Evaluation  Patient identified by MRN, date of birth, ID band Patient awake    Reviewed: Allergy & Precautions, H&P , NPO status , Patient's Chart, lab work & pertinent test results  History of Anesthesia Complications Negative for: history of anesthetic complications  Airway Mallampati: II  TM Distance: >3 FB     Dental   Pulmonary asthma , former smoker,    Pulmonary exam normal        Cardiovascular negative cardio ROS   Rhythm:regular Rate:Normal     Neuro/Psych PSYCHIATRIC DISORDERS Anxiety Depression negative neurological ROS     GI/Hepatic negative GI ROS, Neg liver ROS,   Endo/Other  negative endocrine ROS  Renal/GU negative Renal ROS  negative genitourinary   Musculoskeletal   Abdominal   Peds  Hematology negative hematology ROS (+)   Anesthesia Other Findings   Reproductive/Obstetrics (+) Pregnancy                            Anesthesia Physical Anesthesia Plan  ASA: 2  Anesthesia Plan: Epidural   Post-op Pain Management:    Induction:   PONV Risk Score and Plan:   Airway Management Planned:   Additional Equipment:   Intra-op Plan:   Post-operative Plan:   Informed Consent: I have reviewed the patients History and Physical, chart, labs and discussed the procedure including the risks, benefits and alternatives for the proposed anesthesia with the patient or authorized representative who has indicated his/her understanding and acceptance.       Plan Discussed with:   Anesthesia Plan Comments:         Anesthesia Quick Evaluation

## 2021-10-29 NOTE — Anesthesia Procedure Notes (Signed)
Epidural Patient location during procedure: OB Start time: 10/29/2021 6:37 AM End time: 10/29/2021 6:49 AM  Staffing Anesthesiologist: Lidia Collum, MD Performed: anesthesiologist   Preanesthetic Checklist Completed: patient identified, IV checked, risks and benefits discussed, monitors and equipment checked, pre-op evaluation and timeout performed  Epidural Patient position: sitting Prep: DuraPrep Patient monitoring: heart rate, continuous pulse ox and blood pressure Approach: midline Location: L3-L4 Injection technique: LOR air  Needle:  Needle type: Tuohy  Needle gauge: 17 G Needle length: 9 cm Needle insertion depth: 7 cm Catheter type: closed end flexible Catheter size: 19 Gauge Catheter at skin depth: 12 cm Test dose: negative  Assessment Events: blood not aspirated, injection not painful, no injection resistance, no paresthesia and negative IV test  Additional Notes Reason for block:procedure for pain

## 2021-10-29 NOTE — MAU Note (Signed)
Pt says UC strong since 2345 [facility] VE last week 2 cm Denies HSV GBS- neg

## 2021-10-29 NOTE — H&P (Signed)
OBSTETRIC ADMISSION HISTORY AND PHYSICAL  Kimberly Kidd is a 26 y.o. female G2P1001 with IUP at [redacted]w[redacted]d presenting for SOL. She reports +FMs. No LOF, VB, blurry vision, headaches, peripheral edema, or RUQ pain. She plans on bottle feeding. She requests Nexplanon for birth control.  Dating: By [place] --->  Estimated Date of Delivery: 11/03/21  Sono:   @[redacted]w[redacted]d , normal anatomy, Cephalic presentation, 408g, 64%ile, EFW    Prenatal History/Complications: Hx of PTSD, Anxiety, MDD not on medications.  Past Medical History: Past Medical History:  Diagnosis Date   Anxiety    Asthma    Hx of eating disorder 11/06/2014   Post traumatic stress disorder (PTSD) 11/06/2014   Social anxiety disorder 11/06/2014   Suicidal ideation 11/06/2014    Past Surgical History: Past Surgical History:  Procedure Laterality Date   TONSILLECTOMY     tubes in ears      Obstetrical History: OB History     Gravida  2   Para  1   Term  1   Preterm      AB      Living  1      SAB      IAB      Ectopic      Multiple  0   Live Births  1           Social History: Social History   Socioeconomic History   Marital status: Significant Other    Spouse name: Not on file   Number of children: Not on file   Years of education: Not on file   Highest education level: GED or equivalent  Occupational History   Occupation: unemployed  Tobacco Use   Smoking status: Former    Packs/day: 0.05    Types: Cigarettes   Smokeless tobacco: Former    Types: Snuff  Vaping Use   Vaping Use: Never used  Substance and Sexual Activity   Alcohol use: Not Currently   Drug use: Not Currently    Types: Cocaine    Comment: none during preg   Sexual activity: Yes    Birth control/protection: None  Other Topics Concern   Not on file  Social History Narrative   Not on file   Social Determinants of Health   Financial Resource Strain: Low Risk  (08/11/2021)   Overall Financial Resource Strain (CARDIA)     Difficulty of Paying Living Expenses: Not very hard  Food Insecurity: No Food Insecurity (08/11/2021)   Hunger Vital Sign    Worried About Running Out of Food in the Last Year: Never true    Ran Out of Food in the Last Year: Never true  Transportation Needs: No Transportation Needs (08/11/2021)   PRAPARE - 08/13/2021 (Medical): No    Lack of Transportation (Non-Medical): No  Physical Activity: Insufficiently Active (08/11/2021)   Exercise Vital Sign    Days of Exercise per Week: 3 days    Minutes of Exercise per Session: 40 min  Stress: No Stress Concern Present (08/11/2021)   08/13/2021 of Occupational Health - Occupational Stress Questionnaire    Feeling of Stress : Not at all  Social Connections: Moderately Isolated (08/11/2021)   Social Connection and Isolation Panel [NHANES]    Frequency of Communication with Friends and Family: More than three times a week    Frequency of Social Gatherings with Friends and Family: Three times a week    Attends Religious Services: 1 to 4 times per year  Active Member of Clubs or Organizations: No    Attends Banker Meetings: Never    Marital Status: Never married    Family History: Family History  Problem Relation Age of Onset   Alcohol abuse Maternal Grandfather    Drug abuse Mother    Bipolar disorder Mother    Other Father        murdered   Bipolar disorder Maternal Aunt    Schizophrenia Maternal Uncle    Cancer Paternal Grandfather    Other Maternal Grandmother        open heart surgery   Diabetes Maternal Grandmother     Allergies: No Known Allergies  Medications Prior to Admission  Medication Sig Dispense Refill Last Dose   Prenatal MV & Min w/FA-DHA (PRENATAL ADULT GUMMY/DHA/FA PO) Take by mouth. Takes 2 daily   10/28/2021   acetaminophen (TYLENOL) 500 MG tablet Take 500 mg by mouth every 6 (six) hours as needed for mild pain or fever. (Patient not taking: Reported on 08/11/2021)       albuterol (VENTOLIN HFA) 108 (90 Base) MCG/ACT inhaler Inhale 1-2 puffs into the lungs every 6 (six) hours as needed for wheezing or shortness of breath. (Patient not taking: Reported on 09/02/2021) 18 g 0    Blood Pressure Monitor MISC For regular home bp monitoring during pregnancy 1 each 0    cyclobenzaprine (FLEXERIL) 10 MG tablet Take 1 tablet (10 mg total) by mouth every 8 (eight) hours as needed for muscle spasms. (Patient not taking: Reported on 10/12/2021) 15 tablet 0    Doxylamine-Pyridoxine (DICLEGIS) 10-10 MG TBEC 2 tabs q hs, if sx persist add 1 tab q am on day 3, if sx persist add 1 tab q afternoon on day 4 (Patient not taking: Reported on 10/26/2021) 100 tablet 6      Review of Systems:  All systems reviewed and negative except as stated in HPI  PE: Blood pressure 126/77, pulse (!) 103, temperature 98.1 F (36.7 C), temperature source Oral, resp. rate 18, height 5\' 3"  (1.6 m), weight 96.4 kg, last menstrual period 12/29/2020, SpO2 99 %. General appearance: alert, cooperative, and appears stated age Lungs: regular rate and effort Heart: regular rate  Abdomen: soft, non-tender Extremities: Homans sign is negative, no sign of DVT Presentation: cephalic EFM: 125 bpm, moderate variability, + accels, - decels Toco: 2-31min Dilation: 6 Effacement (%): 80 Station: -2 Exam by:: [email or web address], RN  Prenatal labs: ABO, Rh: A/Positive/-- (03/28 1450) Antibody: Negative (07/13 0856) Rubella: 1.32 (03/28 1450) RPR: Non Reactive (07/13 0856)  HBsAg: Negative (03/28 1450)  HIV: Non Reactive (07/13 0856)  GBS: Negative/-- (09/13 1655)  2 hr GTT: negative  Prenatal Transfer Tool  Maternal Diabetes: No Genetic Screening: Normal Maternal Ultrasounds/Referrals: Normal Fetal Ultrasounds or other Referrals:  None Maternal Substance Abuse:  Prior cocaine and cigarette use Significant Maternal Medications:  None Significant Maternal Lab Results: Group B Strep negative  No  results found for this or any previous visit (from the past 24 hour(s)).  Patient Active Problem List   Diagnosis Date Noted   Encounter for supervision of normal pregnancy, antepartum 04/26/2021   History of abnormal cervical Pap smear 04/18/2018   MDD (major depressive disorder), recurrent episode, severe (HCC) 11/06/2014   Social anxiety disorder 11/06/2014   Post traumatic stress disorder (PTSD) 11/06/2014   Hx of eating disorder 11/06/2014   Suicidal ideation 11/06/2014   Generalized anxiety disorder 12/23/2010    Assessment: Briggette Najarian is a 26 y.o.  G2P1001 at [redacted]w[redacted]d here for SOL.  1. Labor: Progressing well on own. Expectant management. 2. FWB: Cat 1  3. Pain: Epidural 4. GBS: negative 5: MOC: nexplanon   Plan: Admit to Labor and Delivery  Salvadore Oxford, MD  10/29/2021, 5:46 AM _____ GME ATTESTATION:  Evaluation and management procedures were performed by the [facility] Medicine Resident under my supervision. I was immediately available for direct supervision, assistance and direction throughout this encounter.  I also confirm that I have verified the information documented in the resident's note, and that I have also personally reperformed the pertinent components of the physical exam and all of the medical decision making activities.  I have also made any necessary editorial changes.  Shelda Pal, DO OB Fellow, [place] for [place] 10/29/2021 6:23 AM

## 2021-10-29 NOTE — Discharge Summary (Signed)
Postpartum Discharge Summary  Date of Service updated- yes     Patient Name: Kimberly Kidd DOB: [birth year] MRN: [id number]  Date of admission: 10/29/2021 Delivery date:10/29/2021  Delivering provider: Stormy Card  Date of discharge: 10/30/2021  Admitting diagnosis: Indication for care in labor or delivery [O75.9] Intrauterine pregnancy: [redacted]w[redacted]d    Secondary diagnosis:  Principal Problem:   Indication for care in labor or delivery Active Problems:   MDD (major depressive disorder), recurrent episode, severe ([place]   Encounter for supervision of normal pregnancy, antepartum   SVD (spontaneous vaginal delivery)  Additional problems: none    Discharge diagnosis: Term Pregnancy Delivered                                              Post partum procedures: none Augmentation: AROM and Pitocin Complications: None  Hospital course: Onset of Labor With Vaginal Delivery      26y.o. yo [id number] admitted in Active Labor on 10/29/2021. Patient had an uncomplicated labor course as follows:  Membrane Rupture Time/Date: 10:48 AM ,10/29/2021   Delivery Method:Vaginal, Spontaneous  Episiotomy: None  Lacerations:  1st degree;Perineal  Patient had an uncomplicated postpartum course.  She is ambulating, tolerating a regular diet, passing flatus, and urinating well. Patient is discharged home in stable condition on 10/30/21.  Newborn Data: Birth date:10/29/2021  Birth time:1:56 PM  Gender:Female  Living status:Living  Apgars:8 ,9  Weight:3570 g   Magnesium Sulfate received: No BMZ received: No Rhophylac:N/A MMR:N/A, rubella immune T-DaP:Given prenatally Flu: offer flu Transfusion:No  Physical exam  Vitals:   10/29/21 1800 10/29/21 2205 10/30/21 0153 10/30/21 0610  BP: (!) 114/59 100/60 97/65 119/76  Pulse: 97 89 90 75  Resp: '18 18 18 18  ' Temp: 98.2 F (36.8 C) 97.8 F (36.6 C) 97.8 F (36.6 C) (!) 97.5 F (36.4 C)  TempSrc: Oral Oral Oral Oral  SpO2:   100% 98% 100%  Weight:      Height:       General: alert, cooperative, and no distress Lochia: appropriate Uterine Fundus: firm Incision: N/A DVT Evaluation: No evidence of DVT seen on physical exam. Labs: Lab Results  Component Value Date   WBC 13.2 (H) 10/29/2021   HGB 11.7 (L) 10/29/2021   HCT 34.8 (L) 10/29/2021   MCV 84.5 10/29/2021   PLT 215 10/29/2021      Latest Ref Rng & Units 03/05/2021    6:31 PM  CMP  Glucose 70 - 99 mg/dL 58   BUN 6 - 20 mg/dL 6   Creatinine 0.44 - 1.00 mg/dL 0.59   Sodium 135 - 145 mmol/L 135   Potassium 3.5 - 5.1 mmol/L 3.5   Chloride 98 - 111 mmol/L 106   CO2 22 - 32 mmol/L 23   Calcium 8.9 - 10.3 mg/dL 8.6   Total Protein 6.5 - 8.1 g/dL 7.1   Total Bilirubin 0.3 - 1.2 mg/dL 0.5   Alkaline Phos 38 - 126 U/L 51   AST 15 - 41 U/L 16   ALT 0 - 44 U/L 13    Edinburgh Score:    11/11/2018    2:06 PM  Edinburgh Postnatal Depression Scale Screening Tool  I have been able to laugh and see the funny side of things. 0  I have looked forward with enjoyment to things. 1  I have blamed myself unnecessarily when things went wrong. 1  I have been anxious or worried for no good reason. 1  I have felt scared or panicky for no good reason. 0  Things have been getting on top of me. 1  I have been so unhappy that I have had difficulty sleeping. 1  I have felt sad or miserable. 1  I have been so unhappy that I have been crying. 1  The thought of harming myself has occurred to me. 0  Edinburgh Postnatal Depression Scale Total 7     After visit meds:  Allergies as of 10/30/2021   No Known Allergies      Medication List     STOP taking these medications    acetaminophen 500 MG tablet Commonly known as: TYLENOL   Blood Pressure Monitor Misc   cyclobenzaprine 10 MG tablet Commonly known as: FLEXERIL   Doxylamine-Pyridoxine 10-10 MG Tbec Commonly known as: Diclegis       TAKE these medications    albuterol 108 (90 Base) MCG/ACT  inhaler Commonly known as: VENTOLIN HFA Inhale 1-2 puffs into the lungs every 6 (six) hours as needed for wheezing or shortness of breath.   PRENATAL ADULT GUMMY/DHA/FA PO Take by mouth. Takes 2 daily         Discharge home in stable condition Infant Feeding: Breast Infant Disposition:home with mother Discharge instruction: per After Visit Summary and Postpartum booklet. Activity: Advance as tolerated. Pelvic rest for 6 weeks.  Diet: routine diet Future Appointments: Future Appointments  Date Time Provider [place]  11/03/2021  2:10 PM Cresenzo-Dishmon, Joaquim Lai, CNM [facility]    Liliane Channel MD MPH OB Fellow, [place] for [place] 10/30/2021

## 2021-10-30 DIAGNOSIS — Z419 Encounter for procedure for purposes other than remedying health state, unspecified: Secondary | ICD-10-CM | POA: Diagnosis not present

## 2021-10-30 MED ORDER — INFLUENZA VAC SPLIT QUAD 0.5 ML IM SUSY
0.5000 mL | PREFILLED_SYRINGE | INTRAMUSCULAR | Status: AC
  Administered 2021-10-30: 0.5 mL via INTRAMUSCULAR
  Filled 2021-10-30: qty 0.5

## 2021-10-30 MED ORDER — INFLUENZA VAC SPLIT QUAD 0.5 ML IM SUSY: 0.5000 mL | PREFILLED_SYRINGE | INTRAMUSCULAR | Status: DC

## 2021-10-30 NOTE — Lactation Note (Signed)
This note was copied from a baby's chart. Lactation Consultation Note  Patient Name: Kimberly Kidd [id number] Date: 10/30/2021 Reason for consult: Initial assessment;Term;1st time breastfeeding Age:25 hours   P2: Term infant at 39+2 weeks Feeding preference: Formula  RN requested a lactation visit.  Pediatrician suggested birth parent expressed an interest when she made morning rounds.  Birth parent's feeding preference has been formula only until this time.  Visited with birth parent briefly and reviewed basics.  Baby fed 29 mls of formula and still attempted a breast feeding session approximately 2 hours ago.  He is currently swaddled and asleep in the bassinet.  Offered to return to assist with latching at the next feeding; birth parent will call.  RN updated.   Maternal Data Has patient been taught Hand Expression?: No (Will teach when birth parent calls for a feeding assist) Does the patient have breastfeeding experience prior to this delivery?: No  Feeding Mother's Current Feeding Choice: Breast Milk and Formula Nipple Type: Slow - flow  LATCH Score                    Lactation Tools Discussed/Used    Interventions Interventions: Breast feeding basics reviewed  Discharge    Consult Status Consult Status: Follow-up Date: 10/31/21 Follow-up type: In-patient    Kimberly Kidd 10/30/2021, 10:42 AM

## 2021-10-30 NOTE — Progress Notes (Addendum)
CSW received consult for hx of Anxiety,  Depression, history of suicidal ideation, PTSD, and a history of substance abuse prior to pregnancy. CSW met with Kimberly Kidd to offer support and complete assessment. When CSW entered room, Kimberly Kidd was observed in hospital bed, bonding with infant "[name]." FOB was asleep on couch. CSW introduced self and requested to speak with Kimberly Kidd alone. Kimberly Kidd provided verbal consent to speak in front of FOB about anything. CSW explained reason for consult. Kimberly Kidd presented as cheerful and remained engaged throughout encounter.    CSW inquired how Kimberly Kidd has felt emotionally since giving birth. Kimberly Kidd reports mentally, she is feeling "fine", but physically she is hurting. CSW inquired about depression/anxiety symptoms during Kimberly Kidd's pregnancy. Kimberly Kidd reports she felt a little depressed or anxious but most days she felt "fine." CSW inquired what symptoms Kimberly Kidd experienced. Kimberly Kidd reports she did not want to clean and had low energy. CSW inquired if Kimberly Kidd has taken medication or attended therapy in the past for mental health. Kimberly Kidd reports she took Zoloft in [place], sharing it caused side effects. Kimberly Kidd reports she also attended therapy in high school. CSW offered outpatient mental health resources for [place] and [place], [place] accepted. CSW also provided Kimberly Kidd with information for the [facility]. Kimberly Kidd identified FOB, FOB's parents, her mother and grandmother as supports. Kimberly Kidd reports she experienced PPD after she gave birth to her daughter who is now 25 years old. Kimberly Kidd reports her daughter's FOB died while she was pregnant which led to Kimberly Kidd experiencing grief and depressive symptoms during and after her pregnancy. CSW expressed condolences and validated Kimberly Kidd's experience. Kimberly Kidd shares her symptoms of depression resolved after she began working.   CSW inquired about Kimberly Kidd's mental health history. Kimberly Kidd reports she was diagnosed with depression and anxiety as a teenager. CSW  inquired about Kimberly Kidd's "speculation for Bipolar Disorder" in 2017 (Per chart review). Kimberly Kidd reports she believes she was diagnosed with Bipolar Disorder at age 26. CSW inquired if Kimberly Kidd has experienced a manic or hyper-manic (unspecified) episode recently. Kimberly Kidd confirms she has experienced increased energy at times. Kimberly Kidd reports she last experienced a manic or hyper-manic (unspecified) episode marked by decreased sleep and increased energy over 1 year ago. CSW inquired about Kimberly Kidd's diagnosis of PTSD. Kimberly Kidd confirms she was diagnosed with PTSD at age 26. CSW inquired about suicidal ideation. Kimberly Kidd denied suicidal ideation, reporting the last time she endorsed SI was several years ago, shortly after [employer]. CSW inquired about substance use during pregnancy. Kimberly Kidd denied substance use during pregnancy, reporting her last use of illegal substances was shortly after [employer]. Per chart review, Kimberly Kidd has had several past inpatient [facility] hospitalizations (2019, 2016, & 2012), however Kimberly Kidd did not disclose information about hospitalizations during consult. CSW inquired  Kimberly Kidd denied current SI/HI. DV was not assessed due to FOB being present.    Kimberly Kidd reports she has all needed items for infant, including a car seat and bassinet. Kimberly Kidd has chosen [facility] as infant's pediatrician office. Kimberly Kidd reports she receives [facility]. CSW encouraged Kimberly Kidd to notify her caseworker of infant's birth to have infant added to benefits. Kimberly Kidd reports she plans to apply for food stamps and reports she knows how to apply online.    CSW provided education regarding the baby blues period vs. perinatal mood disorders, discussed treatment and gave resources for mental health follow up if concerns arise.  CSW recommends self-evaluation during the postpartum time period using the New Mom Checklist from Postpartum  Progress and encouraged Kimberly Kidd to contact a medical professional if symptoms are noted at any time.     CSW provided review of Sudden Infant  Death Syndrome (SIDS) precautions.     CSW identifies no further need for intervention and no barriers to discharge at this time.   Signed,   K. , MSW, LCSWA, LCASA 10/30/2021 1:57 PM  

## 2021-10-30 NOTE — Lactation Note (Signed)
This note was copied from a baby's chart. Lactation Consultation Note  Patient Name: Kimberly Kidd [id number] Date: 10/30/2021 Reason for consult: Follow-up assessment Age:25 hours   LC Note:  RN requested latch assistance.  When I arrived, RN had assisted birth parent to latch baby.  He appeared to have a good latch; birth parent denied pain with feeding.  Reviewed positioning and breast feeding basics.  Encouraged to continue latching with every breast feeding session prior to providing formula.  Birth parent stated that her first child did not latch so she did not think she could latch her second child.  Encouragement given.  She has our op phone number for any questions/concerns after discharge.    Support person present and asleep on the couch.   Maternal Data Has patient been taught Hand Expression?: No (Will teach when birth parent calls for a feeding assist) Does the patient have breastfeeding experience prior to this delivery?: No  Feeding Mother's Current Feeding Choice: Breast Milk and Formula  LATCH Score Latch: Grasps breast easily, tongue down, lips flanged, rhythmical sucking.  Audible Swallowing: A few with stimulation  Type of Nipple: Everted at rest and after stimulation  Comfort (Breast/Nipple): Soft / non-tender  Hold (Positioning): Assistance needed to correctly position infant at breast and maintain latch.  LATCH Score: 8   Lactation Tools Discussed/Used    Interventions Interventions: Breast feeding basics reviewed  Discharge Discharge Education: Engorgement and breast care  Consult Status Consult Status: Complete Date: 10/30/21 Follow-up type: Call as needed    Lanice Schwab Deddrick Saindon 10/30/2021, 12:23 PM

## 2021-10-30 NOTE — Anesthesia Postprocedure Evaluation (Signed)
Anesthesia Post Note  Patient: Kimberly Kidd  Procedure(s) Performed: AN AD [place]     Patient location during evaluation: Mother Baby Anesthesia Type: Epidural Level of consciousness: awake and alert Pain management: pain level controlled Vital Signs Assessment: post-procedure vital signs reviewed and stable Respiratory status: spontaneous breathing, nonlabored ventilation and respiratory function stable Cardiovascular status: stable Postop Assessment: no headache, no backache, epidural receding, no apparent nausea or vomiting, patient able to bend at knees, adequate PO intake and able to ambulate Anesthetic complications: no   No notable events documented.  Last Vitals:  Vitals:   10/30/21 0153 10/30/21 0610  BP: 97/65 119/76  Pulse: 90 75  Resp: 18 18  Temp: 36.6 C (!) 36.4 C  SpO2: 98% 100%    Last Pain:  Vitals:   10/30/21 0845  TempSrc:   PainSc: 5    Pain Goal: Patients Stated Pain Goal: 2 (10/30/21 0845)                 Jabier Mutton

## 2021-11-02 ENCOUNTER — Encounter: Payer: Medicaid Other | Admitting: Advanced Practice Midwife

## 2021-11-03 ENCOUNTER — Encounter: Payer: Medicaid Other | Admitting: Advanced Practice Midwife

## 2021-11-07 ENCOUNTER — Telehealth (HOSPITAL_COMMUNITY): Payer: Self-pay

## 2021-11-07 NOTE — Telephone Encounter (Signed)
No answer. No voicemail option available.   Sharyn Lull Keilee Denman,RN3,MSN,RNC-MNN  11-07-21,1941

## 2021-11-30 DIAGNOSIS — Z419 Encounter for procedure for purposes other than remedying health state, unspecified: Secondary | ICD-10-CM | POA: Diagnosis not present

## 2021-12-12 ENCOUNTER — Ambulatory Visit: Payer: Medicaid Other | Admitting: Women's Health

## 2021-12-27 ENCOUNTER — Ambulatory Visit (INDEPENDENT_AMBULATORY_CARE_PROVIDER_SITE_OTHER): Payer: Medicaid Other | Admitting: Women's Health

## 2021-12-27 ENCOUNTER — Encounter: Payer: Self-pay | Admitting: Women's Health

## 2021-12-27 DIAGNOSIS — Z30017 Encounter for initial prescription of implantable subdermal contraceptive: Secondary | ICD-10-CM

## 2021-12-27 DIAGNOSIS — Z3202 Encounter for pregnancy test, result negative: Secondary | ICD-10-CM

## 2021-12-27 LAB — POCT URINE PREGNANCY: Preg Test, Ur: NEGATIVE

## 2021-12-27 MED ORDER — ETONOGESTREL 68 MG ~~LOC~~ IMPL
68.0000 mg | DRUG_IMPLANT | Freq: Once | SUBCUTANEOUS | Status: AC
  Administered 2021-12-27: 68 mg via SUBCUTANEOUS

## 2021-12-27 NOTE — Addendum Note (Signed)
Addended by: Dereck Ligas on: 12/27/2021 03:50 PM   Modules accepted: Orders

## 2021-12-27 NOTE — Progress Notes (Signed)
POSTPARTUM VISIT Patient name: Kimberly Kidd MRN [id number]  Date of birth: Jan 19, 1996 Chief Complaint:   Postpartum Care (Last sexual encounter was 2.5 weeks ago )  History of Present Illness:   Kimberly Kidd is a 26 y.o. G51P2002 Caucasian female being seen today for a postpartum visit. She is 8 weeks postpartum following a spontaneous vaginal delivery at 39.2 gestational weeks. IOL: no, for n/a. Anesthesia: epidural.  Laceration: 1st degree.  Complications: none. Inpatient contraception: no.   Pregnancy uncomplicated. Tobacco use: former . Substance use disorder: no. Last pap smear: 05/14/19 and results were NILM w/ HRHPV not done. Next pap smear due: 2024 Patient's last menstrual period was 12/20/2021 (approximate).  Postpartum course has been uncomplicated. Bleeding none. Bowel function is normal. Bladder function is normal. Urinary incontinence? no, fecal incontinence? no Patient is sexually active. Last sexual activity:  ~2.5wks ago . Desired contraception: Nexplanon. Patient does want a pregnancy in the future.  Desired family size is 3 children.   Upstream - 12/27/21 1457       Pregnancy Intention Screening   Does the patient want to become pregnant in the next year? No    Does the patient's partner want to become pregnant in the next year? No    Would the patient like to discuss contraceptive options today? Yes      Contraception Wrap Up   Current Method No Method - Other Reason    End Method Hormonal Implant    Contraception Counseling Provided No            The pregnancy intention screening data noted above was reviewed. Potential methods of contraception were discussed. The patient elected to proceed with Hormonal Implant.  Edinburgh Postpartum Depression Screening: negative  Edinburgh Postnatal Depression Scale - 12/27/21 1458       Edinburgh Postnatal Depression Scale:  In the Past 7 Days   I have been able to laugh and see the funny side of things. 0     I have looked forward with enjoyment to things. 0    I have blamed myself unnecessarily when things went wrong. 2    I have been anxious or worried for no good reason. 2    I have felt scared or panicky for no good reason. 1    Things have been getting on top of me. 1    I have been so unhappy that I have had difficulty sleeping. 0    I have felt sad or miserable. 1    I have been so unhappy that I have been crying. 1    The thought of harming myself has occurred to me. 0    Edinburgh Postnatal Depression Scale Total 8                08/11/2021    9:30 AM 04/26/2021    2:26 PM 05/14/2019    1:47 PM  GAD 7 : Generalized Anxiety Score  Nervous, Anxious, on Edge _0 Control/stop worrying 0 0 1  Worry too much - different things 1 0 1  Trouble relaxing 0 0 0  Restless 0 0 0  Easily annoyed or irritable 1 0 0  Afraid - awful might happen 1 1 0  Total GAD 7 Score _1 Baby's course has been uncomplicated. Baby is feeding by bottle. Infant has a pediatrician/family doctor? Yes.  Childcare strategy if returning to work/school: family.  Pt has material needs met for her  and baby: Yes.   Review of Systems:   Pertinent items are noted in HPI Denies Abnormal vaginal discharge w/ itching/odor/irritation, headaches, visual changes, shortness of breath, chest pain, abdominal pain, severe nausea/vomiting, or problems with urination or bowel movements. Pertinent History Reviewed:  Reviewed past medical,surgical, obstetrical and family history.  Reviewed problem list, medications and allergies. OB History  Gravida Para Term Preterm AB Living  _0 SAB IAB Ectopic Multiple Live Births        0 2    # Outcome Date GA Lbr Len/2nd Weight Sex Delivery Anes PTL Lv  2 Term 10/29/21 5w2d13:36 / 00:20 7 lb 13.9 oz (3.57 kg) M Vag-Spont EPI  LIV  1 Term 09/12/18 476w3d6:54 / 00:21 6 lb 14.8 oz (3.14 kg) F Vag-Spont EPI N LIV   Physical Assessment:   Vitals:   12/27/21 1456   BP: 117/70  Pulse: 67  Weight: 175 lb 6 oz (79.5 kg)  Height: 5' 3.5" (1.613 m)  Body mass index is 30.58 kg/m.       Physical Examination:   General appearance: alert, well appearing, and in no distress  Mental status: alert, oriented to person, place, and time  Skin: warm & dry   Cardiovascular: normal heart rate noted   Respiratory: normal respiratory effort, no distress   Breasts: deferred, no complaints   Abdomen: soft, non-tender   Pelvic: examination not indicated. Thin prep pap obtained: No  Rectal: not examined  Extremities: Edema: none   Chaperone: N/A         Results for orders placed or performed in visit on 12/27/21 (from the past 24 hour(s))  POCT urine pregnancy   Collection Time: 12/27/21  3:11 PM  Result Value Ref Range   Preg Test, Ur Negative Negative     NEXPLANON INSERTION  Risks/benefits/side effects of Nexplanon have been discussed and her questions have been answered.  Specifically, a failure rate of 01/998 has been reported, with an increased failure rate if pt takes [facility]/or antiseizure medicaitons.  She is aware of the common side effect of irregular bleeding, which the incidence of decreases over time. Signed copy of informed consent in chart.   Time out was performed.  She is right-handed, so her left arm, approximately 10cm from the medial epicondyle and 3-5cm posterior to the sulcus, was cleansed with alcohol and anesthetized with 2cc of 2% Lidocaine.  The area was cleansed again with betadine and the Nexplanon was inserted per manufacturer's recommendations without difficulty.  3 steri-strips and pressure bandage were applied. The patient tolerated the procedure well.   Assessment & Plan:  1) Postpartum exam 2) 8 wks s/p spontaneous vaginal delivery 3) bottle feeding 4) Depression screening 5) Nexplanon insertion Pt was instructed to keep the area clean and dry, remove pressure bandage in 24 hours, and keep insertion site  covered with the steri-strip for 3-5 days.  Condoms for 2 weeks.  She was given a card indicating date Nexplanon was inserted and date it needs to be removed. Follow-up PRN problems.  Essential components of care per ACOG recommendations:  1.  Mood and well being:  If positive depression screen, discussed and plan developed.  If using tobacco we discussed reduction/cessation and risk of relapse If current substance abuse, we discussed and referral to local resources was offered.   2. Infant care and feeding:  If breastfeeding, discussed returning to work, pumping, breastfeeding-associated pain, guidance regarding  return to fertility while lactating if not using another method. If needed, patient was provided with a letter to be allowed to pump q 2-3hrs to support lactation in a private location with access to a refrigerator to store breastmilk.   Recommended that all caregivers be immunized for flu, pertussis and other preventable communicable diseases If pt does not have material needs met for her/baby, referred to local resources for help obtaining these.  3. Sexuality, contraception and birth spacing Provided guidance regarding sexuality, management of dyspareunia, and resumption of intercourse Discussed avoiding interpregnancy interval <33mhs and recommended birth spacing of 18 months  4. Sleep and fatigue Discussed coping options for fatigue and sleep disruption Encouraged family/partner/community support of 4 hrs of uninterrupted sleep to help with mood and fatigue  5. Physical recovery  If pt had a C/S, assessed incisional pain and providing guidance on normal vs prolonged recovery If pt had a laceration, perineal healing and pain reviewed.  If urinary or fecal incontinence, discussed management and referred to PT or uro/gyn if indicated  Patient is safe to resume physical activity. Discussed attainment of healthy weight.  6.  Chronic disease management Discussed pregnancy  complications if any, and their implications for future childbearing and long-term maternal health. Review recommendations for prevention of recurrent pregnancy complications, such as 17 hydroxyprogesterone caproate to reduce risk for recurrent PTB no, or aspirin to reduce risk of preeclampsia not applicable. Pt had GDM: No. If yes, 2hr GTT scheduled: not applicable. Reviewed medications and non-pregnant dosing including consideration of whether pt is breastfeeding using a reliable resource such as LactMed: not applicable Referred for f/u w/ PCP or subspecialist providers as indicated: not applicable  7. Health maintenance Mammogram at [address] earlier if indicated Pap smears as indicated  Meds: No orders of the defined types were placed in this encounter.   Follow-up: Return for April for , Pap & physical.   Orders Placed This Encounter  Procedures   POCT urine pregnancy    [facility]11/28/2023 3:37 PM

## 2021-12-27 NOTE — Patient Instructions (Signed)
Keep the area clean and dry.  You can remove the big bandage in 24 hours, and the small steri-strip bandage in 3-5 days.  A back up method, such as condoms, should be used for two weeks. You may have irregular vaginal bleeding for the first 6 months after the Nexplanon is placed, then the bleeding usually lightens and it is possible that you may not have any periods.  If you have any concerns, please give us a call.    Etonogestrel Implant What is this medication? ETONOGESTREL (et oh noe JES trel) prevents ovulation and pregnancy. It belongs to a group of medications called contraceptives. This medication is a progestin hormone. This medicine may be used for other purposes; ask your health care provider or pharmacist if you have questions. COMMON BRAND NAME(S): Implanon, Nexplanon What should I tell my care team before I take this medication? They need to know if you have any of these conditions: Abnormal vaginal bleeding Blood clots Blood vessel disease Breast, cervical, endometrial, ovarian, liver, or uterine cancer Diabetes Gallbladder disease Heart disease or recent heart attack High blood pressure High cholesterol or triglycerides Kidney disease Liver disease Migraine headaches Seizures Stroke Tobacco use An unusual or allergic reaction to etonogestrel, other medications, foods, dyes, or preservatives Pregnant or trying to get pregnant Breastfeeding How should I use this medication? This device is inserted just under the skin on the inner side of your upper arm by your care team. Talk to your care team about the use of this medication in children. Special care may be needed. Overdosage: If you think you have taken too much of this medicine contact a poison control center or emergency room at once. NOTE: This medicine is only for you. Do not share this medicine with others. What if I miss a dose? This does not apply. What may interact with this medication? Do not take this  medication with any of the following: Amprenavir Fosamprenavir This medication may also interact with the following: Acitretin Aprepitant Armodafinil Bexarotene Bosentan Carbamazepine Certain antivirals for HIV or hepatitis Certain medications for fungal infections, such as fluconazole, ketoconazole, itraconazole, or voriconazole Cyclosporine Felbamate Griseofulvin Lamotrigine Modafinil Oxcarbazepine Phenobarbital Phenytoin Primidone Rifabutin Rifampin Rifapentine St. John's wort Topiramate This list may not describe all possible interactions. Give your health care provider a list of all the medicines, herbs, non-prescription drugs, or dietary supplements you use. Also tell them if you smoke, drink alcohol, or use illegal drugs. Some items may interact with your medicine. What should I watch for while using this medication? Visit your care team for regular checks on your progress. Using this medication does not protect you or your partner against HIV or other sexually transmitted infections (STIs). You should be able to feel the implant by pressing your fingertips over the skin where it was inserted. Contact your care team if you cannot feel the implant, and use a non-hormonal birth control method (such as condoms) until your care team confirms that the implant is in place. Contact your care team if you think that the implant may have broken or become bent while in your arm. You will receive a user card from your care team after the implant is inserted. The card is a record of the location of the implant in your upper arm and when it should be removed. Keep this card with your health records. What side effects may I notice from receiving this medication? Side effects that you should report to your care team as soon as   possible: Allergic reactions--skin rash, itching, hives, swelling of the face, lips, tongue, or throat Blood clot--pain, swelling, or warmth in the leg, shortness of  breath, chest pain Gallbladder problems--severe stomach pain, nausea, vomiting, fever Increase in blood pressure Liver injury--right upper belly pain, loss of appetite, nausea, light-colored stool, dark yellow or brown urine, yellowing skin or eyes, unusual weakness or fatigue New or worsening migraines or headaches Pain, redness, or irritation at injection site Stroke--sudden numbness or weakness of the face, arm, or leg, trouble speaking, confusion, trouble walking, loss of balance or coordination, dizziness, severe headache, change in vision Unusual vaginal discharge, itching, or odor Worsening mood, feelings of depression Side effects that usually do not require medical attention (report to your care team if they continue or are bothersome): Breast pain or tenderness Dark patches of skin on the face or other sun-exposed areas Irregular menstrual cycles or spotting Nausea Weight gain This list may not describe all possible side effects. Call your doctor for medical advice about side effects. You may report side effects to FDA at 1-800-FDA-1088. Where should I keep my medication? This medication is given in a hospital or clinic and will not be stored at home. NOTE: This sheet is a summary. It may not cover all possible information. If you have questions about this medicine, talk to your doctor, pharmacist, or health care provider.  2023 Elsevier/Gold Standard (2021-08-23 00:00:00)  

## 2021-12-30 DIAGNOSIS — Z419 Encounter for procedure for purposes other than remedying health state, unspecified: Secondary | ICD-10-CM | POA: Diagnosis not present

## 2022-01-30 DIAGNOSIS — Z419 Encounter for procedure for purposes other than remedying health state, unspecified: Secondary | ICD-10-CM | POA: Diagnosis not present

## 2022-02-12 DIAGNOSIS — E663 Overweight: Secondary | ICD-10-CM | POA: Diagnosis not present

## 2022-02-12 DIAGNOSIS — Z6827 Body mass index (BMI) 27.0-27.9, adult: Secondary | ICD-10-CM | POA: Diagnosis not present

## 2022-02-12 DIAGNOSIS — J069 Acute upper respiratory infection, unspecified: Secondary | ICD-10-CM | POA: Diagnosis not present

## 2022-03-02 DIAGNOSIS — Z419 Encounter for procedure for purposes other than remedying health state, unspecified: Secondary | ICD-10-CM | POA: Diagnosis not present

## 2022-03-11 DIAGNOSIS — F419 Anxiety disorder, unspecified: Secondary | ICD-10-CM | POA: Diagnosis not present

## 2022-03-11 DIAGNOSIS — Z6826 Body mass index (BMI) 26.0-26.9, adult: Secondary | ICD-10-CM | POA: Diagnosis not present

## 2022-03-11 DIAGNOSIS — E663 Overweight: Secondary | ICD-10-CM | POA: Diagnosis not present

## 2022-03-11 DIAGNOSIS — R079 Chest pain, unspecified: Secondary | ICD-10-CM | POA: Diagnosis not present

## 2022-03-11 DIAGNOSIS — K219 Gastro-esophageal reflux disease without esophagitis: Secondary | ICD-10-CM | POA: Diagnosis not present

## 2022-03-31 DIAGNOSIS — Z419 Encounter for procedure for purposes other than remedying health state, unspecified: Secondary | ICD-10-CM | POA: Diagnosis not present

## 2022-04-10 DIAGNOSIS — R112 Nausea with vomiting, unspecified: Secondary | ICD-10-CM | POA: Diagnosis not present

## 2022-04-20 ENCOUNTER — Encounter (HOSPITAL_COMMUNITY): Payer: Self-pay

## 2022-04-20 ENCOUNTER — Other Ambulatory Visit: Payer: Self-pay

## 2022-04-20 ENCOUNTER — Emergency Department (HOSPITAL_COMMUNITY)
Admission: EM | Admit: 2022-04-20 | Discharge: 2022-04-20 | Disposition: A | Discharge status: Home / Self Care | Payer: Medicaid Other | Attending: Emergency Medicine | Admitting: Emergency Medicine

## 2022-04-20 DIAGNOSIS — R079 Chest pain, unspecified: Secondary | ICD-10-CM | POA: Diagnosis not present

## 2022-04-20 DIAGNOSIS — K625 Hemorrhage of anus and rectum: Secondary | ICD-10-CM | POA: Diagnosis not present

## 2022-04-20 DIAGNOSIS — J45909 Unspecified asthma, uncomplicated: Secondary | ICD-10-CM | POA: Diagnosis not present

## 2022-04-20 DIAGNOSIS — R0789 Other chest pain: Secondary | ICD-10-CM | POA: Diagnosis not present

## 2022-04-20 DIAGNOSIS — K5900 Constipation, unspecified: Secondary | ICD-10-CM | POA: Diagnosis not present

## 2022-04-20 LAB — URINALYSIS, ROUTINE W REFLEX MICROSCOPIC
Bacteria, UA: NONE SEEN
Bilirubin Urine: NEGATIVE
Glucose, UA: NEGATIVE mg/dL
Ketones, ur: NEGATIVE mg/dL
Leukocytes,Ua: NEGATIVE
Nitrite: NEGATIVE
Protein, ur: NEGATIVE mg/dL
Specific Gravity, Urine: 1.003 — ABNORMAL LOW (ref 1.005–1.030)
pH: 7 (ref 5.0–8.0)

## 2022-04-20 LAB — POC URINE PREG, ED: Preg Test, Ur: NEGATIVE

## 2022-04-20 LAB — COMPREHENSIVE METABOLIC PANEL
ALT: 13 U/L (ref 0–44)
AST: 14 U/L — ABNORMAL LOW (ref 15–41)
Albumin: 4.5 g/dL (ref 3.5–5.0)
Alkaline Phosphatase: 51 U/L (ref 38–126)
Anion gap: 9 (ref 5–15)
BUN: 7 mg/dL (ref 6–20)
CO2: 25 mmol/L (ref 22–32)
Calcium: 9.7 mg/dL (ref 8.9–10.3)
Chloride: 104 mmol/L (ref 98–111)
Creatinine, Ser: 0.66 mg/dL (ref 0.44–1.00)
GFR, Estimated: >60 mL/min (ref >60)
Glucose, Bld: 121 mg/dL — ABNORMAL HIGH (ref 70–99)
Potassium: 3.8 mmol/L (ref 3.5–5.1)
Sodium: 138 mmol/L (ref 135–145)
Total Bilirubin: 0.7 mg/dL (ref 0.3–1.2)
Total Protein: 7.6 g/dL (ref 6.5–8.1)

## 2022-04-20 LAB — CBC
HCT: 44.5 % (ref 36.0–46.0)
Hemoglobin: 14.6 g/dL (ref 12.0–15.0)
MCH: 27.9 pg (ref 26.0–34.0)
MCHC: 32.8 g/dL (ref 30.0–36.0)
MCV: 84.9 fL (ref 80.0–100.0)
Platelets: 293 10*3/uL (ref 150–400)
RBC: 5.24 MIL/uL — ABNORMAL HIGH (ref 3.87–5.11)
RDW: 13.3 % (ref 11.5–15.5)
WBC: 5.5 10*3/uL (ref 4.0–10.5)
nRBC: 0 % (ref 0.0–0.2)

## 2022-04-20 LAB — LIPASE, BLOOD: Lipase: 40 U/L (ref 11–51)

## 2022-04-20 NOTE — Discharge Instructions (Addendum)
You were seen in the ER for chest pain, constipation, and rectal bleeding.  As we discussed, your work was reassuring today.  Your hemoglobin counts were normal.  I suspect that the rectal bleeding you are experiencing is likely from constipation.  I have attached some recommendations for how to help with this.  Your EKG was normal today.   Please follow up with your primary care provider regarding your visit today. If you do not have a primary care provider, you may reach out to [facility] at [phone] to establish with one and make your first appointment.  Continue to monitor how you're doing and return to the ER for new or worsening symptoms.

## 2022-04-20 NOTE — ED Triage Notes (Signed)
Pt reports she was seen at urgent care the other day for anxiety and chest pains and was told her EKG was irregular(EKG is normal in triage), and was given info for mental health and told to follow up about EKG at ER.

## 2022-04-20 NOTE — ED Provider Notes (Signed)
[facility] Provider Note   CSN: [id number] Arrival date & time: 04/20/22  1233     History  Chief Complaint  Patient presents with   Chest Pain    Kimberly Kidd is a 27 y.o. female with history of anxiety, asthma, PTSD who presents the emergency department with multiple complaints.  Patient initially presented complaining of chest pain intermittently for the past several days.  She reports that she went to urgent care the other day for similar symptoms including anxiety, and was given mental health resources.  They had obtained an EKG that they told her was "abnormal", and told her to follow-up in the ER about this.  Since then the chest pain has continued to happen, with associated lightheadedness and cold chills.  Also complaining of abdominal pain, constipation, and now bright red blood per rectum.  She has been taking laxatives for some constipation, and while she was able to have bowel movements, she saw blood per rectum and having pain with bowel movements.  Reports that she had similar symptoms 5 months ago after vaginal delivery, but they resolved on their own.  Never been diagnosed with anemia, but has had multiple people told her that she may have it as she frequently feels cold, craves ice, and bruises easily.   Chest Pain Associated symptoms: abdominal pain        Home Medications Prior to Admission medications   Medication Sig Start Date End Date Taking? Authorizing Provider  albuterol (VENTOLIN HFA) 108 (90 Base) MCG/ACT inhaler Inhale 1-2 puffs into the lungs every 6 (six) hours as needed for wheezing or shortness of breath. Patient not taking: Reported on 09/02/2021 08/31/21   Volney American, PA-C  Prenatal MV & Min w/FA-DHA (PRENATAL ADULT GUMMY/DHA/FA PO) Take by mouth. Takes 2 daily Patient not taking: Reported on 12/27/2021    [provider]      Allergies    Patient has no known allergies.     Review of Systems   Review of Systems  Constitutional:  Positive for chills.  Cardiovascular:  Positive for chest pain.  Gastrointestinal:  Positive for abdominal pain, blood in stool and constipation.  Neurological:  Positive for light-headedness.  All other systems reviewed and are negative.   Physical Exam Updated Vital Signs BP 111/64   Pulse (!) 58   Temp 98.4 F (36.9 C) (Oral)   Resp 17   Ht 5\' 3"  (1.6 m)   Wt 64.9 kg   LMP  (LMP Unknown)   SpO2 100%   BMI 25.33 kg/m  Physical Exam Vitals and nursing note reviewed.  Constitutional:      Appearance: Normal appearance.  HENT:     Head: Normocephalic and atraumatic.  Eyes:     Conjunctiva/sclera: Conjunctivae normal.  Cardiovascular:     Rate and Rhythm: Normal rate and regular rhythm.  Pulmonary:     Effort: Pulmonary effort is normal. No respiratory distress.     Breath sounds: Normal breath sounds.  Abdominal:     General: There is no distension.     Palpations: Abdomen is soft.     Tenderness: There is abdominal tenderness in the suprapubic area. There is no guarding.  Skin:    General: Skin is warm and dry.  Neurological:     General: No focal deficit present.     Mental Status: She is alert.     ED Results / Procedures / Treatments   Labs (all labs  ordered are listed, but only abnormal results are displayed) Labs Reviewed  COMPREHENSIVE METABOLIC PANEL - Abnormal; Notable for the following components:      Result Value   Glucose, Bld 121 (*)    AST 14 (*)    All other components within normal limits  CBC - Abnormal; Notable for the following components:   RBC 5.24 (*)    All other components within normal limits  URINALYSIS, ROUTINE W REFLEX MICROSCOPIC - Abnormal; Notable for the following components:   Color, Urine STRAW (*)    Specific Gravity, Urine 1.003 (*)    Hgb urine dipstick MODERATE (*)    All other components within normal limits  LIPASE, BLOOD  POC URINE PREG, ED     EKG None  Radiology No results found.  Procedures Procedures    Medications Ordered in ED Medications - No data to display  ED Course/ Medical Decision Making/ A&P                             Medical Decision Making Amount and/or Complexity of Data Reviewed Labs: ordered.  This patient is a 27 y.o. female  who presents to the ED for concern of chest pain, abdominal pain, lightheadedness, blood per rectum.   Differential diagnoses prior to evaluation: The emergent differential diagnosis includes, but is not limited to,  lower GI bleed, anemia, constipation, hemorrhoids. This is not an exhaustive differential.   Past Medical History / Co-morbidities: anxiety, asthma, PTSD  Additional history: Chart reviewed. Pertinent results include: Cannot view records or EKG from urgent care visit several days ago  Physical Exam: Physical exam performed. The pertinent findings include: Normal vital signs, no acute distress.  Abdomen soft, with some mild suprapubic tenderness to palpation.  Lab Tests/Imaging studies: I personally interpreted labs/imaging and the pertinent results include: No leukocytosis, normal hemoglobin.  CMP unremarkable, normal lipase.  Negative pregnancy. Urinalysis with moderate hemoglobin, unremarkable for infection.   Cardiac monitoring: EKG obtained and interpreted by my attending physician which shows: Normal sinus rhythm   Disposition: After consideration of the diagnostic results and the patients response to treatment, I feel that emergency department workup does not suggest an emergent condition requiring admission or immediate intervention beyond what has been performed at this time. The plan is: discharge to home with recommendations for OTC meds for constipation and likely hemorrhoids. Low concern for acute lower GI bleed due to reassuring laboratory evaluation and overall unremarkable physical exam. Chest pain not likely ACS due to pattern of symptoms  and no ischemic changes on EKG, heart score of 1. Wells criteria for PE low risk. Patient plans to follow up on anxiety resources given by urgent care and establish with PCP. The patient is safe for discharge and has been instructed to return immediately for worsening symptoms, change in symptoms or any other concerns.  Final Clinical Impression(s) / ED Diagnoses Final diagnoses:  Nonspecific chest pain  Rectal bleeding  Constipation, unspecified constipation type    Rx / DC Orders ED Discharge Orders     None      Portions of this report may have been transcribed using voice recognition software. Every effort was made to ensure accuracy; however, inadvertent computerized transcription errors may be present.    Estill Cotta 04/20/22 1632    Milton Ferguson, MD 04/21/22 [phone]

## 2022-05-01 DIAGNOSIS — Z419 Encounter for procedure for purposes other than remedying health state, unspecified: Secondary | ICD-10-CM | POA: Diagnosis not present

## 2022-05-30 DIAGNOSIS — J069 Acute upper respiratory infection, unspecified: Secondary | ICD-10-CM | POA: Diagnosis not present

## 2022-05-30 DIAGNOSIS — Z6824 Body mass index (BMI) 24.0-24.9, adult: Secondary | ICD-10-CM | POA: Diagnosis not present

## 2022-05-31 DIAGNOSIS — Z419 Encounter for procedure for purposes other than remedying health state, unspecified: Secondary | ICD-10-CM | POA: Diagnosis not present

## 2022-06-05 DIAGNOSIS — Z6825 Body mass index (BMI) 25.0-25.9, adult: Secondary | ICD-10-CM | POA: Diagnosis not present

## 2022-06-05 DIAGNOSIS — R5383 Other fatigue: Secondary | ICD-10-CM | POA: Diagnosis not present

## 2022-06-05 DIAGNOSIS — E663 Overweight: Secondary | ICD-10-CM | POA: Diagnosis not present

## 2022-07-01 DIAGNOSIS — Z419 Encounter for procedure for purposes other than remedying health state, unspecified: Secondary | ICD-10-CM | POA: Diagnosis not present

## 2022-07-31 DIAGNOSIS — Z419 Encounter for procedure for purposes other than remedying health state, unspecified: Secondary | ICD-10-CM | POA: Diagnosis not present

## 2022-08-27 ENCOUNTER — Encounter (HOSPITAL_COMMUNITY): Payer: Self-pay

## 2022-08-27 ENCOUNTER — Other Ambulatory Visit: Payer: Self-pay

## 2022-08-27 ENCOUNTER — Emergency Department (HOSPITAL_COMMUNITY): Payer: Medicaid Other

## 2022-08-27 ENCOUNTER — Emergency Department (HOSPITAL_COMMUNITY)
Admission: EM | Admit: 2022-08-27 | Discharge: 2022-08-27 | Disposition: A | Discharge status: Home / Self Care | Payer: Medicaid Other | Attending: Emergency Medicine | Admitting: Emergency Medicine

## 2022-08-27 DIAGNOSIS — R1084 Generalized abdominal pain: Secondary | ICD-10-CM | POA: Diagnosis not present

## 2022-08-27 DIAGNOSIS — R110 Nausea: Secondary | ICD-10-CM | POA: Diagnosis not present

## 2022-08-27 DIAGNOSIS — Z711 Person with feared health complaint in whom no diagnosis is made: Secondary | ICD-10-CM | POA: Diagnosis not present

## 2022-08-27 DIAGNOSIS — Z202 Contact with and (suspected) exposure to infections with a predominantly sexual mode of transmission: Secondary | ICD-10-CM | POA: Diagnosis not present

## 2022-08-27 DIAGNOSIS — R1030 Lower abdominal pain, unspecified: Secondary | ICD-10-CM | POA: Diagnosis not present

## 2022-08-27 LAB — COMPREHENSIVE METABOLIC PANEL
ALT: 15 U/L (ref 0–44)
AST: 15 U/L (ref 15–41)
Albumin: 4.4 g/dL (ref 3.5–5.0)
Alkaline Phosphatase: 66 U/L (ref 38–126)
Anion gap: 6 (ref 5–15)
BUN: 7 mg/dL (ref 6–20)
CO2: 24 mmol/L (ref 22–32)
Calcium: 9.3 mg/dL (ref 8.9–10.3)
Chloride: 104 mmol/L (ref 98–111)
Creatinine, Ser: 0.64 mg/dL (ref 0.44–1.00)
GFR, Estimated: >60 mL/min (ref >60)
Glucose, Bld: 95 mg/dL (ref 70–99)
Potassium: 3.7 mmol/L (ref 3.5–5.1)
Sodium: 134 mmol/L — ABNORMAL LOW (ref 135–145)
Total Bilirubin: 0.8 mg/dL (ref 0.3–1.2)
Total Protein: 7.9 g/dL (ref 6.5–8.1)

## 2022-08-27 LAB — CBC WITH DIFFERENTIAL/PLATELET
Abs Immature Granulocytes: 0.04 10*3/uL (ref 0.00–0.07)
Basophils Absolute: 0.1 10*3/uL (ref 0.0–0.1)
Basophils Relative: 1 %
Eosinophils Absolute: 0.2 10*3/uL (ref 0.0–0.5)
Eosinophils Relative: 2 %
HCT: 44.6 % (ref 36.0–46.0)
Hemoglobin: 14.8 g/dL (ref 12.0–15.0)
Immature Granulocytes: 1 %
Lymphocytes Relative: 20 %
Lymphs Abs: 1.7 10*3/uL (ref 0.7–4.0)
MCH: 28.4 pg (ref 26.0–34.0)
MCHC: 33.2 g/dL (ref 30.0–36.0)
MCV: 85.4 fL (ref 80.0–100.0)
Monocytes Absolute: 0.4 10*3/uL (ref 0.1–1.0)
Monocytes Relative: 5 %
Neutro Abs: 6.1 10*3/uL (ref 1.7–7.7)
Neutrophils Relative %: 71 %
Platelets: 296 10*3/uL (ref 150–400)
RBC: 5.22 MIL/uL — ABNORMAL HIGH (ref 3.87–5.11)
RDW: 12.8 % (ref 11.5–15.5)
WBC: 8.4 10*3/uL (ref 4.0–10.5)
nRBC: 0 % (ref 0.0–0.2)

## 2022-08-27 LAB — WET PREP, GENITAL
Clue Cells Wet Prep HPF POC: NONE SEEN
Sperm: NONE SEEN
Trich, Wet Prep: NONE SEEN
WBC, Wet Prep HPF POC: <10 (ref <10)
Yeast Wet Prep HPF POC: NONE SEEN

## 2022-08-27 LAB — URINALYSIS, ROUTINE W REFLEX MICROSCOPIC
Bilirubin Urine: NEGATIVE
Glucose, UA: NEGATIVE mg/dL
Hgb urine dipstick: NEGATIVE
Ketones, ur: NEGATIVE mg/dL
Leukocytes,Ua: NEGATIVE
Nitrite: NEGATIVE
Protein, ur: NEGATIVE mg/dL
Specific Gravity, Urine: 1.006 (ref 1.005–1.030)
pH: 6 (ref 5.0–8.0)

## 2022-08-27 LAB — POC URINE PREG, ED: Preg Test, Ur: NEGATIVE

## 2022-08-27 LAB — LIPASE, BLOOD: Lipase: 27 U/L (ref 11–51)

## 2022-08-27 MED ORDER — DOXYCYCLINE HYCLATE 100 MG PO CAPS: 100.0000 mg | ORAL_CAPSULE | Freq: Two times a day (BID) | ORAL | 0 refills | Status: AC

## 2022-08-27 MED ORDER — DEXTROSE 5 % IV SOLN
500.0000 mg | Freq: Once | INTRAVENOUS | Status: AC
  Administered 2022-08-27: 500 mg via INTRAVENOUS
  Filled 2022-08-27: qty 500

## 2022-08-27 MED ORDER — IOHEXOL 300 MG/ML  SOLN
100.0000 mL | Freq: Once | INTRAMUSCULAR | Status: AC | PRN
  Administered 2022-08-27: 100 mL via INTRAVENOUS

## 2022-08-27 MED ORDER — KETOROLAC TROMETHAMINE 15 MG/ML IJ SOLN
15.0000 mg | Freq: Once | INTRAMUSCULAR | Status: AC
  Administered 2022-08-27: 15 mg via INTRAVENOUS
  Filled 2022-08-27: qty 1

## 2022-08-27 NOTE — Discharge Instructions (Addendum)
As we discussed, your workup in the ER today was reassuring for acute findings.  Laboratory evaluation and CT imaging did not reveal any emergent causes for your condition.  You were swabbed for gonorrhea chlamydia and these take 24 to 48 hours to result.  Please monitor these results on your MyChart online for these results and if they are positive you will need to inform all sexual partners of positive test results and abstain from sexual intercourse until you can follow-up with your doctor to have a test of cure.  You have chosen to go ahead and get treated for these 2 conditions which involved a shot in the arm which you received today as well as an oral antibiotic that you need to fill and take as prescribed in its entirety for management of your symptoms.  Follow-up with your primary doctor for continued evaluation and management of your pain as even if you are positive for STIs I have a low suspicion it is correlated with you presenting symptoms today.  Return if development of any new or worsening symptoms.

## 2022-08-27 NOTE — ED Triage Notes (Signed)
Pt states her lower abd started hurting yesterday evening and has gotten worse this morning, accompanied with nausea. Pt states she had a BM yesterday. States her ex boyfriend cheated on her and her mom is afraid she may have an STD. Pt states she has had some white vaginal discharge, denies pain, burning or itching.

## 2022-08-27 NOTE — ED Provider Notes (Signed)
[place] EMERGENCY DEPARTMENT AT [facility] Provider Note   CSN: [id number] Arrival date & time: 08/27/22  1027     History  Chief Complaint  Patient presents with   Abdominal Pain    Kimberly Kidd is a 27 y.o. female.  Patient with history of GAD, MDD presents today with complaints of abdominal pain. She states that same began yesterday evening and has been persistent since then. Pain is in her periumbilical region and does not radiate. She notes associated nausea without vomiting or diarrhea. She is having regular bowel movements. Does state that her sexual partner has had unprotected intercourse with others and is concerned for STI. She does note she is having some white discharge. No history of abdominal surgeries. No history of similar symptoms previously. LMP was 4 months ago, however this is normal for her as she has Nexplanon implant in place.   The history is provided by the patient. No language interpreter was used.  Abdominal Pain      Home Medications Prior to Admission medications   Medication Sig Start Date End Date Taking? Authorizing Provider  albuterol (VENTOLIN HFA) 108 (90 Base) MCG/ACT inhaler Inhale 1-2 puffs into the lungs every 6 (six) hours as needed for wheezing or shortness of breath. Patient not taking: Reported on 09/02/2021 08/31/21   Particia Nearing, PA-C  Prenatal MV & Min w/FA-DHA (PRENATAL ADULT GUMMY/DHA/FA PO) Take by mouth. Takes 2 daily Patient not taking: Reported on 12/27/2021    [provider]      Allergies    Patient has no known allergies.    Review of Systems   Review of Systems  Gastrointestinal:  Positive for abdominal pain.  All other systems reviewed and are negative.   Physical Exam Updated Vital Signs BP 132/84   Pulse 85   Temp 98 F (36.7 C) (Oral)   Resp 18   Ht 5\' 3"  (1.6 m)   Wt 64.9 kg   SpO2 100%   BMI 25.33 kg/m  Physical Exam Vitals and nursing note reviewed. Exam conducted  with a chaperone present.  Constitutional:      General: She is not in acute distress.    Appearance: Normal appearance. She is normal weight. She is not ill-appearing, toxic-appearing or diaphoretic.  HENT:     Head: Normocephalic and atraumatic.  Cardiovascular:     Rate and Rhythm: Normal rate.  Pulmonary:     Effort: Pulmonary effort is normal. No respiratory distress.  Abdominal:     General: Abdomen is flat.     Palpations: Abdomen is soft.     Tenderness: There is abdominal tenderness in the periumbilical area. There is no guarding or rebound.  Genitourinary:    Vagina: Normal.     Cervix: Normal.     Uterus: Normal.      Adnexa: Right adnexa normal and left adnexa normal.     Comments: Vagina without discharge, no CMT or adnexal tenderness Musculoskeletal:        General: Normal range of motion.     Cervical back: Normal range of motion.  Skin:    General: Skin is warm and dry.  Neurological:     General: No focal deficit present.     Mental Status: She is alert.  Psychiatric:        Mood and Affect: Mood normal.        Behavior: Behavior normal.     ED Results / Procedures / Treatments   Labs (  all labs ordered are listed, but only abnormal results are displayed) Labs Reviewed  COMPREHENSIVE METABOLIC PANEL - Abnormal; Notable for the following components:      Result Value   Sodium 134 (*)    All other components within normal limits  CBC WITH DIFFERENTIAL/PLATELET - Abnormal; Notable for the following components:   RBC 5.22 (*)    All other components within normal limits  URINALYSIS, ROUTINE W REFLEX MICROSCOPIC - Abnormal; Notable for the following components:   Color, Urine STRAW (*)    All other components within normal limits  WET PREP, GENITAL  LIPASE, BLOOD  HIV ANTIBODY (ROUTINE TESTING W REFLEX)  POC URINE PREG, ED  GC/CHLAMYDIA PROBE AMP ([place]) NOT AT [facility]    EKG None  Radiology CT ABDOMEN PELVIS W CONTRAST  Result Date:  08/27/2022 CLINICAL DATA:  Lower abdominal pain EXAM: CT ABDOMEN AND PELVIS WITH CONTRAST TECHNIQUE: Multidetector CT imaging of the abdomen and pelvis was performed using the standard protocol following bolus administration of intravenous contrast. RADIATION DOSE REDUCTION: This exam was performed according to the departmental dose-optimization program which includes automated exposure control, adjustment of the mA and/or kV according to patient size and/or use of iterative reconstruction technique. CONTRAST:  OMNIPAQUE IOHEXOL 300 MG/ML  SOLN COMPARISON:  None Available. FINDINGS: Lower chest: No acute abnormality. Hepatobiliary: No focal liver abnormality is seen. No gallstones, gallbladder wall thickening, or biliary dilatation. Pancreas: Unremarkable. No pancreatic ductal dilatation or surrounding inflammatory changes. Spleen: Normal in size without focal abnormality. Adrenals/Urinary Tract: Adrenal glands are unremarkable. Kidneys are normal, without renal calculi, focal lesion, or hydronephrosis. Bladder is unremarkable. Stomach/Bowel: Stomach is within normal limits. Appendix appears normal. No evidence of bowel wall thickening, distention, or inflammatory changes. Vascular/Lymphatic: No significant vascular findings are present. No enlarged abdominal or pelvic lymph nodes. Reproductive: Uterus and bilateral adnexa are unremarkable. Other: No abdominal wall hernia or abnormality. No abdominopelvic ascites. Musculoskeletal: No acute or significant osseous findings. IMPRESSION: Normal CT scan of the abdomen and pelvis. No abnormality to explain the patient's clinical symptoms. Electronically Signed   By: Malachy Moan M.D.   On: 08/27/2022 10:31    Procedures Procedures    Medications Ordered in ED Medications  iohexol (OMNIPAQUE) 300 MG/ML solution 100 mL (100 mLs Intravenous Contrast Given 08/27/22 1007)  ketorolac (TORADOL) 15 MG/ML injection 15 mg (15 mg Intravenous Given 08/27/22 1156)   cefTRIAXone (ROCEPHIN) 500 mg in dextrose 5 % 50 mL IVPB (500 mg Intravenous New Bag/Given 08/27/22 1245)    ED Course/ Medical Decision Making/ A&P                             Medical Decision Making Amount and/or Complexity of Data Reviewed Labs: ordered. Radiology: ordered.  Risk Prescription drug management.   This patient is a 27 y.o. female who presents to the ED for concern of abdominal pain, this involves an extensive number of treatment options, and is a complaint that carries with it a high risk of complications and morbidity. The emergent differential diagnosis prior to evaluation includes, but is not limited to,  PID, ectopic pregnancy, appendicitis, kidney stone, dysmenorrhea, septic abortion, ruptured ovarian cyst, ovarian torsion, tubo-ovarian abscess, fibroids, endometriosis, diverticulitis, cystitis.   This is not an exhaustive differential.   Past Medical History / Co-morbidities / Social History: history of GAD, MDD  Physical Exam: Physical exam performed. The pertinent findings include: Generalized mild abdominal ttp. No CMT  or vaginal discharge.  Lab Tests: I ordered, and personally interpreted labs.  The pertinent results include:  no acute laboratory abnormalities   Imaging Studies: I ordered imaging studies including CT abdomen pelvis. I independently visualized and interpreted imaging which showed   Normal CT scan of the abdomen and pelvis. No abnormality to explain the patient's clinical symptoms.  I agree with the radiologist interpretation.   Medications: I ordered medication including toradol  for pain. Reevaluation of the patient after these medicines showed that the patient improved. I have reviewed the patients home medicines and have made adjustments as needed.   Disposition: After consideration of the diagnostic results and the patients response to treatment, I feel that emergency department workup does not suggest an emergent condition  requiring admission or immediate intervention beyond what has been performed at this time. The plan is: d/c with empiric STI treatment with Rocephin and doxycycline and close outpatient follow-up with return precautions.  Patient's workup is benign.  I have a very low suspicion for PID or TOA.  Doubt correlation between patient's possible STI and her presenting symptoms.  After Toradol she is feeling better and is ready to go home.  Pt understands that they have GC/Chlamydia cultures pending and that they will need to inform all sexual partners if results return positive. Pt not concerning for PID because hemodynamically stable and no cervical motion tenderness on pelvic exam.  Patient to be discharged with instructions to follow up with OBGYN/PCP. Discussed importance of using protection when sexually active. Evaluation and diagnostic testing in the emergency department does not suggest an emergent condition requiring admission or immediate intervention beyond what has been performed at this time.  Plan for discharge with close PCP follow-up.  Patient is understanding and amenable with plan, educated on red flag symptoms that would prompt immediate return.  Patient discharged in stable condition.   Final Clinical Impression(s) / ED Diagnoses Final diagnoses:  Generalized abdominal pain  Concern about STD in female without diagnosis    Rx / DC Orders ED Discharge Orders          Ordered    doxycycline (VIBRAMYCIN) 100 MG capsule  2 times daily        08/27/22 1241          An After Visit Summary was printed and given to the patient.     Vear Clock 08/27/22 1329    Bethann Berkshire, MD 08/30/22 1606

## 2022-08-31 DIAGNOSIS — Z419 Encounter for procedure for purposes other than remedying health state, unspecified: Secondary | ICD-10-CM | POA: Diagnosis not present

## 2022-09-19 DIAGNOSIS — J069 Acute upper respiratory infection, unspecified: Secondary | ICD-10-CM | POA: Diagnosis not present

## 2022-09-19 DIAGNOSIS — R030 Elevated blood-pressure reading, without diagnosis of hypertension: Secondary | ICD-10-CM | POA: Diagnosis not present

## 2022-09-19 DIAGNOSIS — E663 Overweight: Secondary | ICD-10-CM | POA: Diagnosis not present

## 2022-09-19 DIAGNOSIS — Z6827 Body mass index (BMI) 27.0-27.9, adult: Secondary | ICD-10-CM | POA: Diagnosis not present

## 2022-09-25 IMAGING — US US OB < 14 WEEKS - US OB TV
1 series · 13 of 28 positions shown · non-contrast
Comparison: Pelvis CT 02/05/2003.

CLINICAL DATA: 25-year-old female with cramping in the 1st
trimester of pregnancy. Quantitative beta hCG [DATE]. Estimated
gestational age by LMP 9 weeks and 0 days.

EXAM:
OBSTETRIC <14 WK US AND TRANSVAGINAL OB US
TECHNIQUE: Both transabdominal and transvaginal ultrasound examinations were
performed for complete evaluation of the gestation as well as the
maternal uterus, adnexal regions, and pelvic cul-de-sac.
Transvaginal technique was performed to assess early pregnancy.

[Series 1: us ob comp less 14 wks · 13 of 79 slices shown]
[im 3/79]
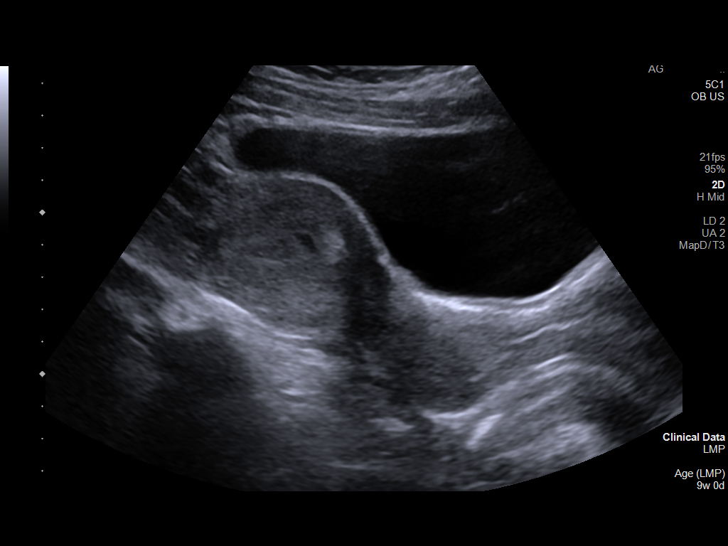
[im 9/79]
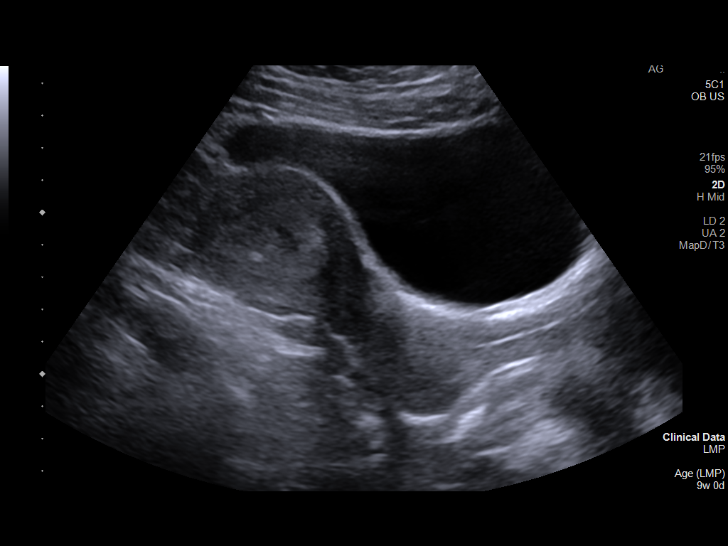
[im 15/79]
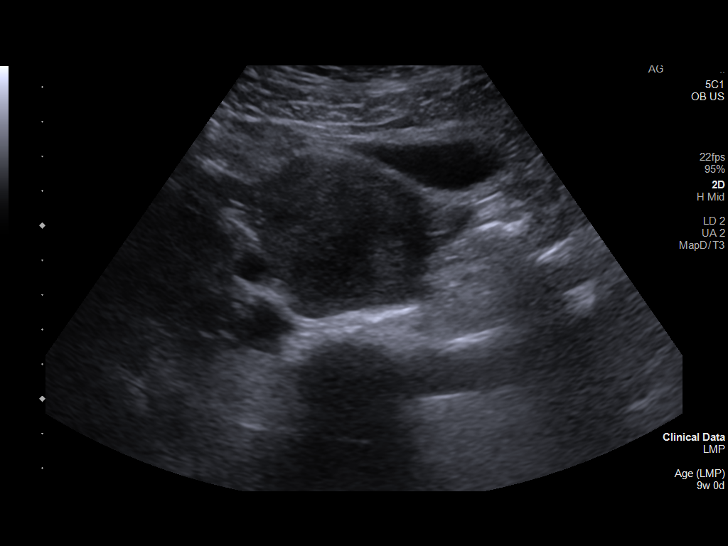
[im 21/79]
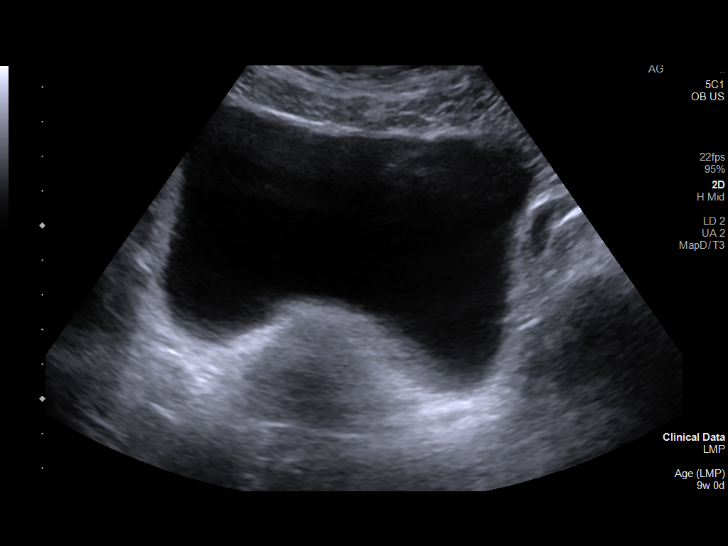
[im 27/79]
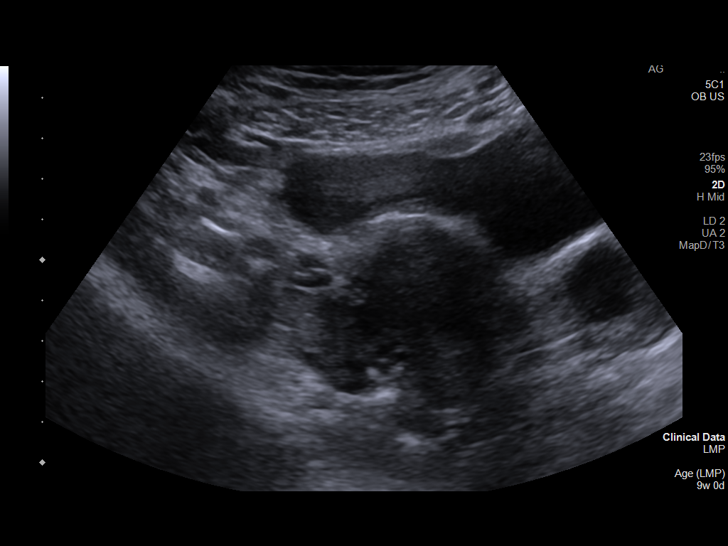
[im 32/79]
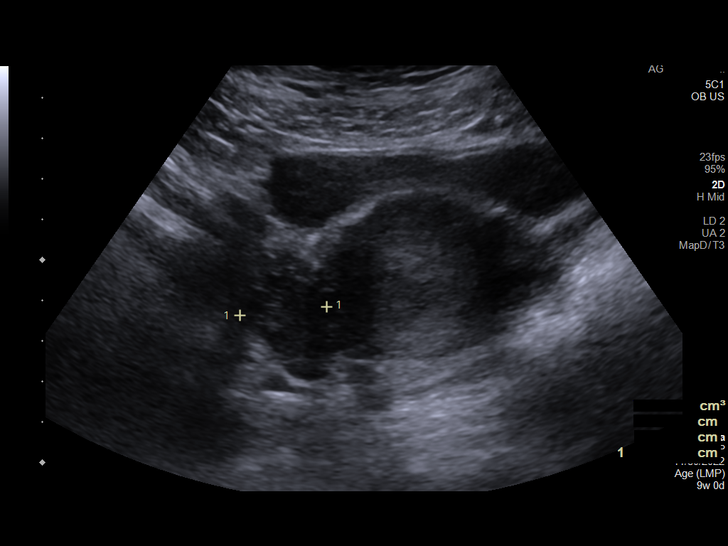
[im 41/79]
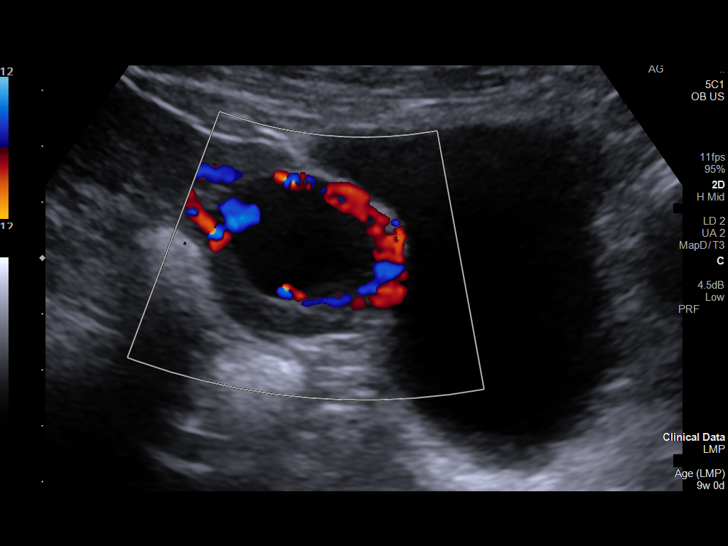
[im 47/79]
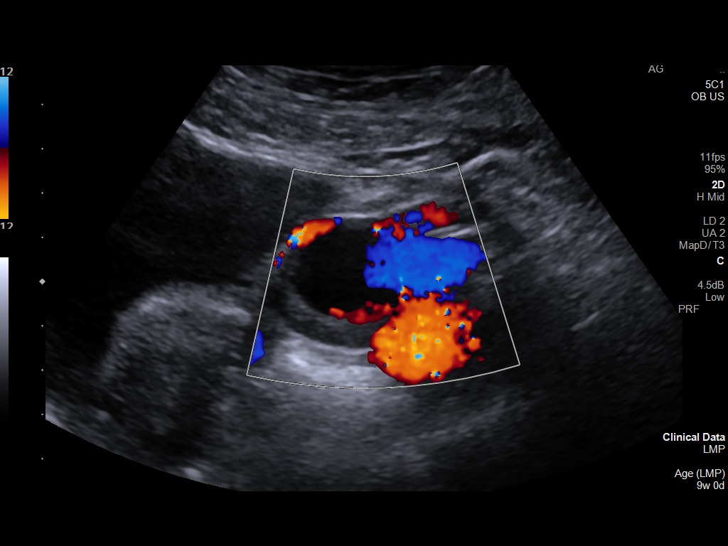
[im 53/79]
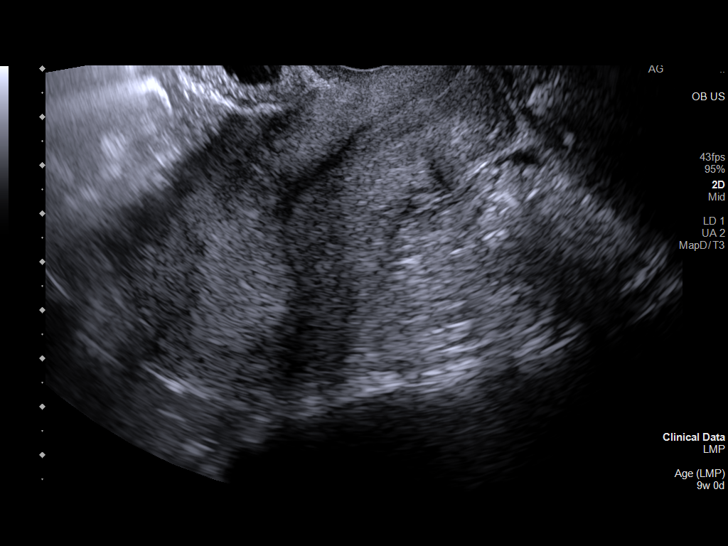
[im 58/79]
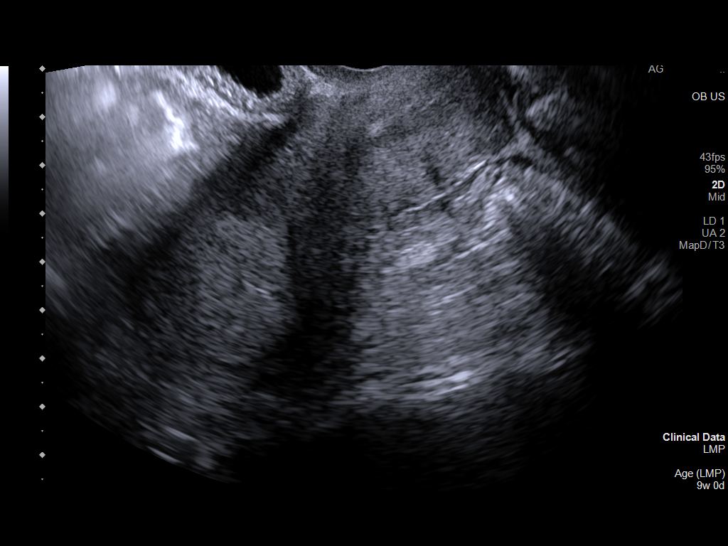
[im 64/79]
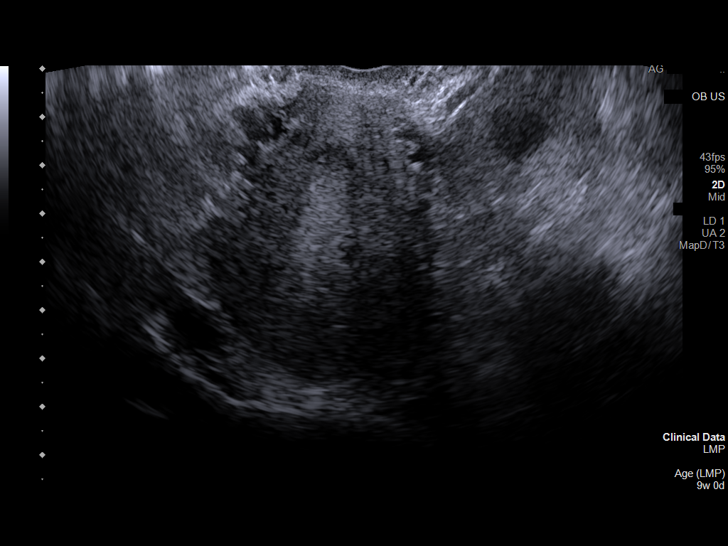
[im 70/79]
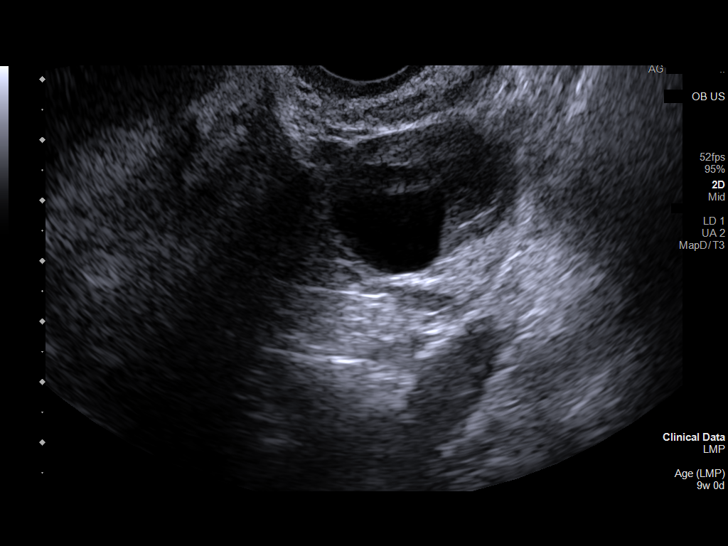
[im 76/79]
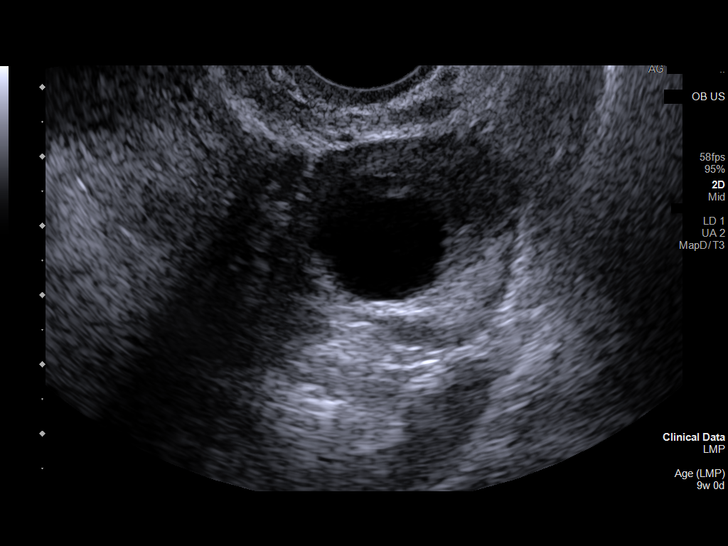

[13 of 28 positions shown; findings below may reference images not displayed]

FINDINGS: Intrauterine gestational sac: None

Maternal uterus/adnexae: The right ovary appears normal measuring
3.8 x 1.8 by 2.2 cm (8 mL). Left ovary is larger measuring 3.6 x
by 3.3 cm and contains a simple appearing cyst with surrounding
hypervascularity (image 43) which is favored to be the corpus
luteum. No pelvic free fluid.

Homogeneous appearing endometrial thickening (image 60).
IMPRESSION: No IUP, suspicious adnexal finding, or pelvic free fluid. Left
ovarian corpus luteum cyst suspected.

Differential considerations include failed IUP, early IUP, and
occult ectopic pregnancy.

Recommend serial quantitative beta HCG, and repeat ultrasound as
necessary.

## 2022-09-28 IMAGING — US US OB < 14 WEEKS - US OB TV
1 series · 13 of 28 positions shown · non-contrast
Comparison: None.

CLINICAL DATA: Pelvic pain, positive urine pregnancy test, LMP
12/29/2020

EXAM:
OBSTETRIC <14 WK US AND TRANSVAGINAL OB US
TECHNIQUE: Both transabdominal and transvaginal ultrasound examinations were
performed for complete evaluation of the gestation as well as the
maternal uterus, adnexal regions, and pelvic cul-de-sac.
Transvaginal technique was performed to assess early pregnancy.

[Series 1: early ob us · arterial · 110 acquisitions, 13 frames shown]
[im 5/110]
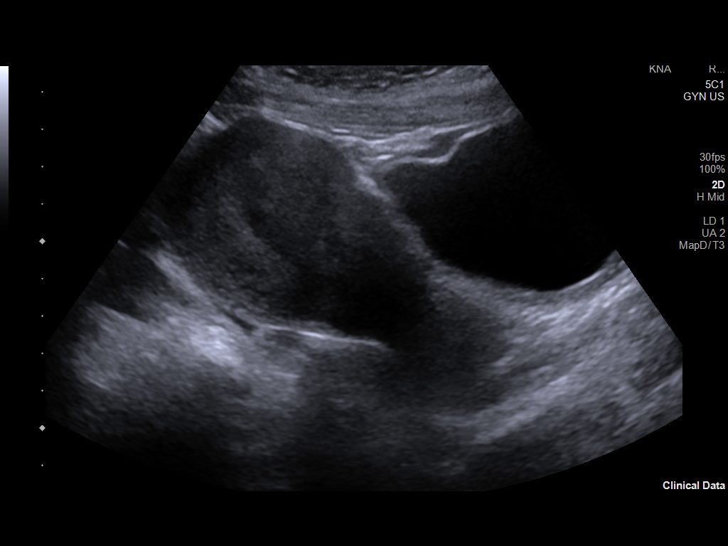
[im 13/110]
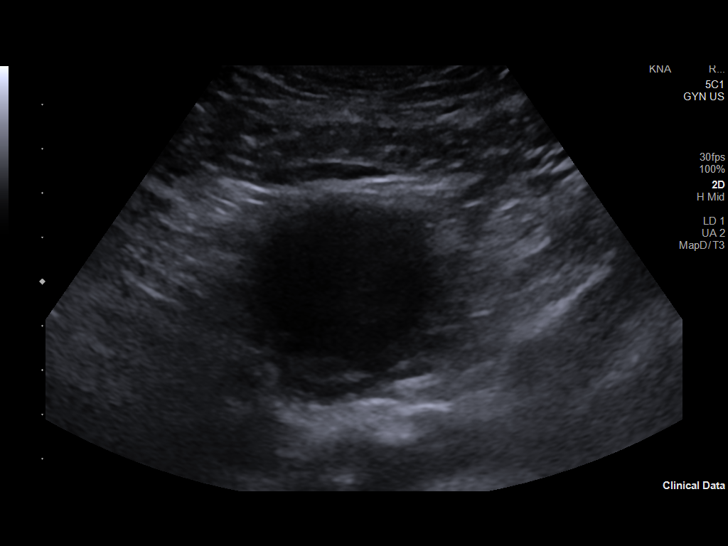
[im 21/110]
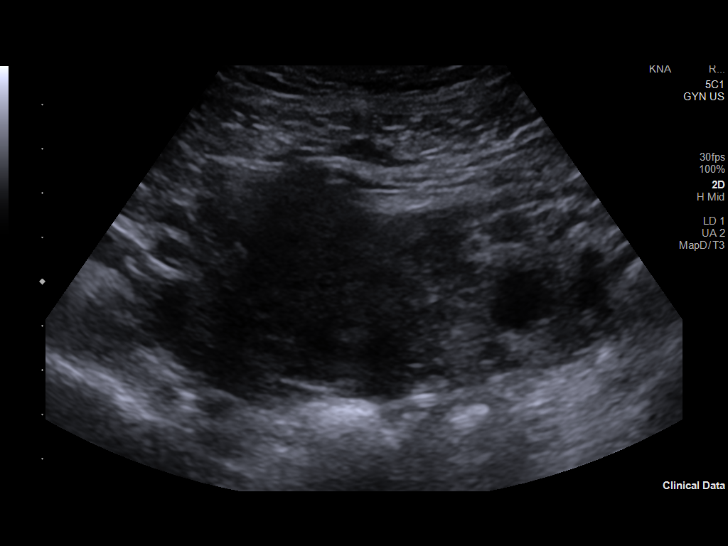
[im 29/110]
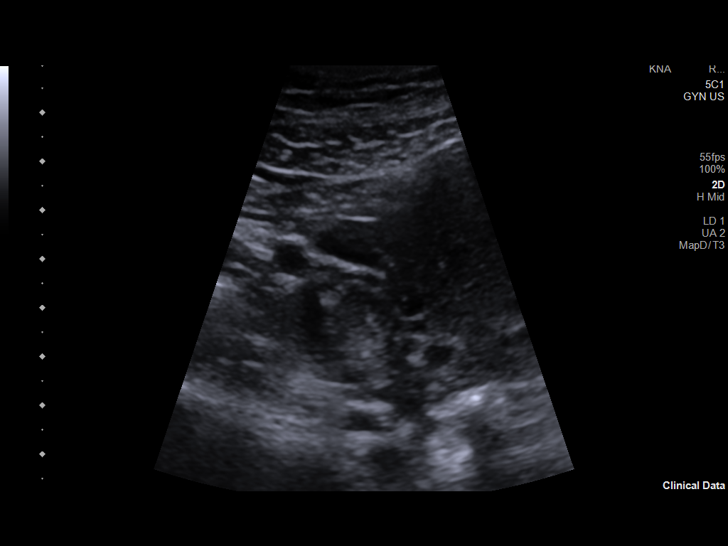
[im 37/110]
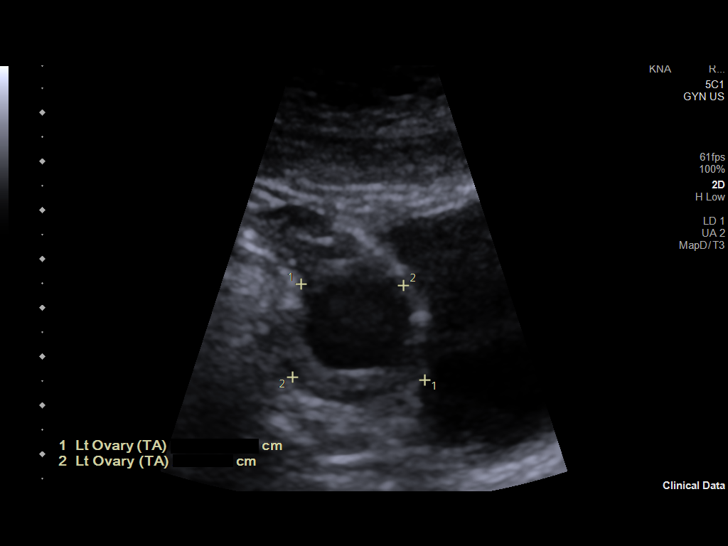
[im 45/110]
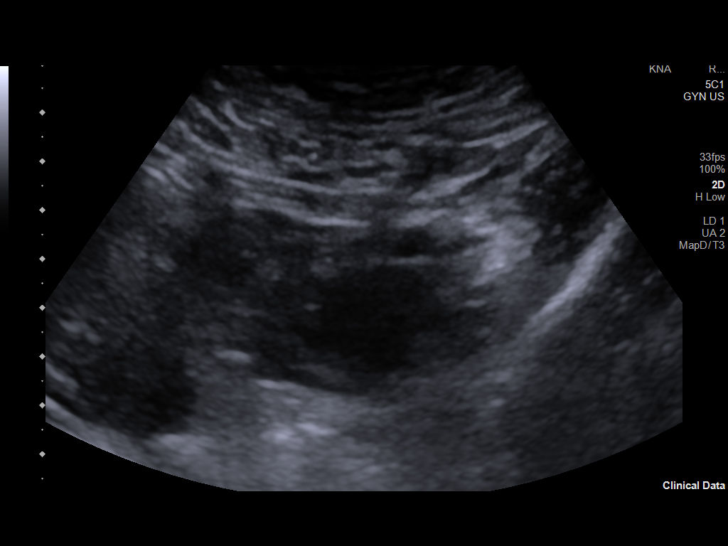
[im 57/110]
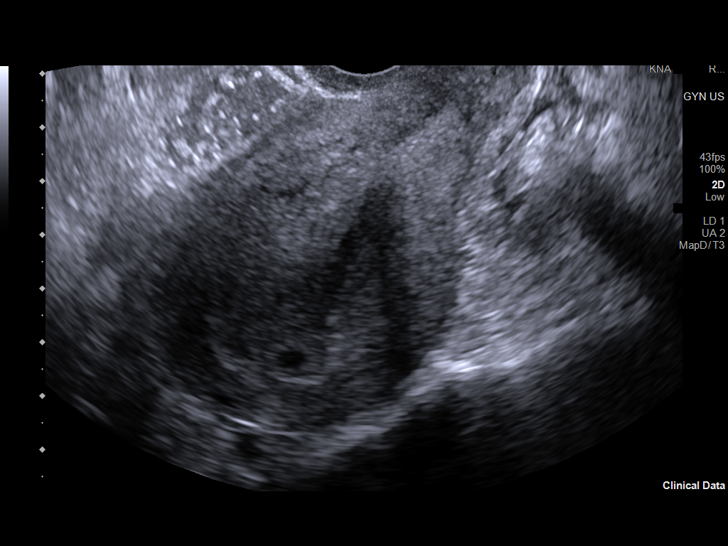
[im 65/110]
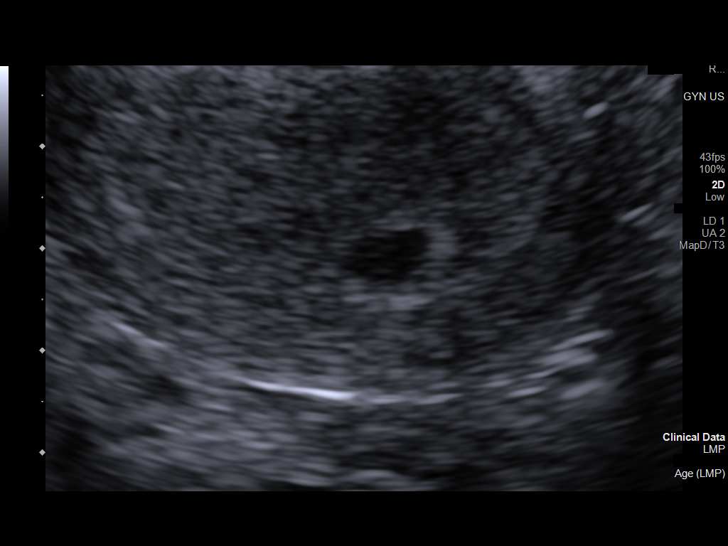
[im 73/110]
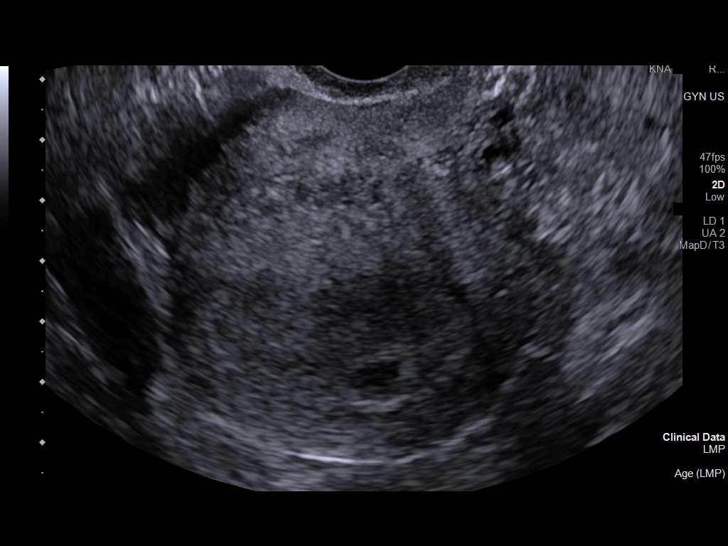
[im 81/110]
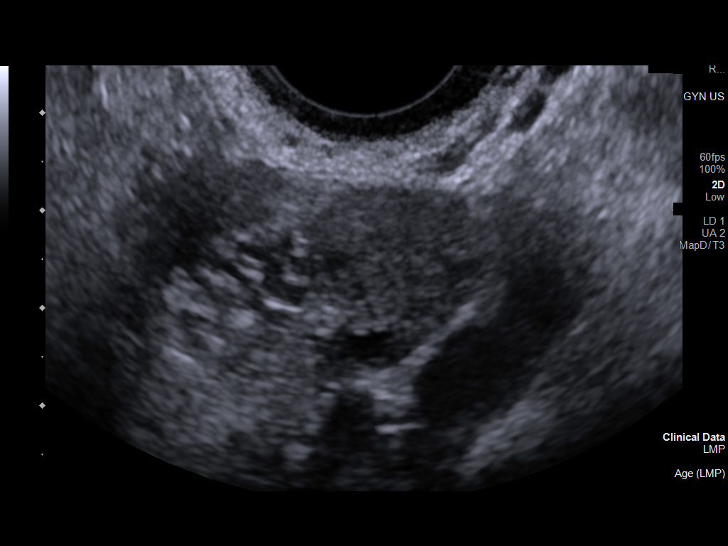
[im 89/110]
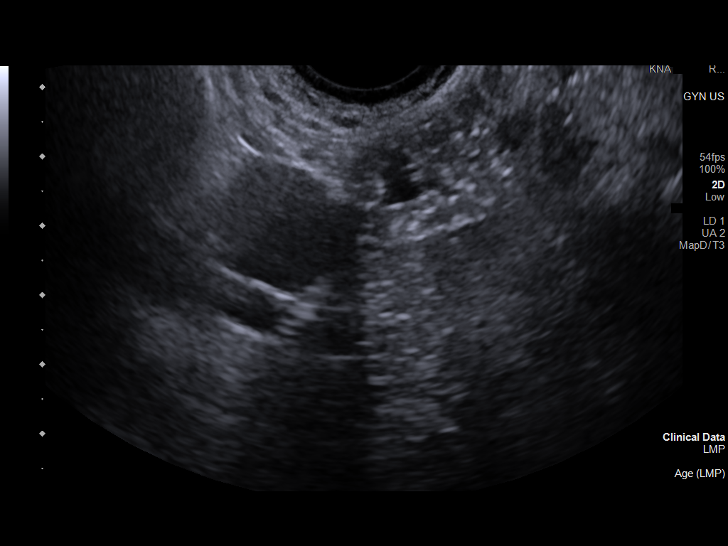
[im 97/110]
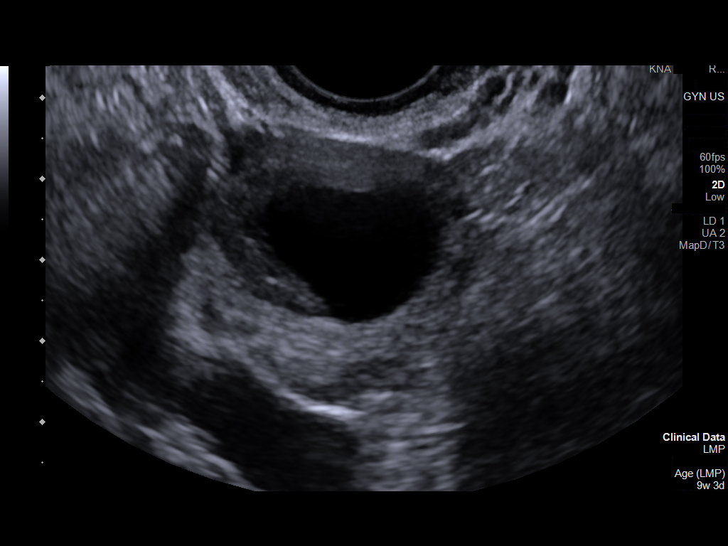
[im 105/110]
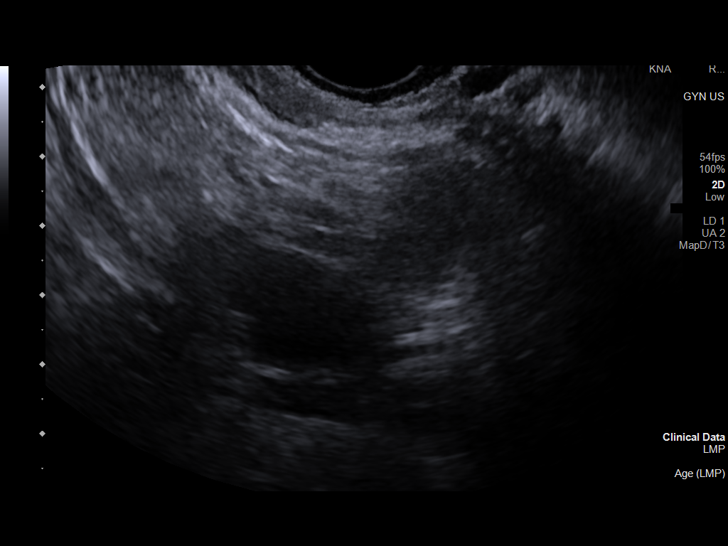

[13 of 28 positions shown; findings below may reference images not displayed]

FINDINGS: Intrauterine gestational sac: Present (best visualized on
transvaginal imaging

Yolk sac:  Not visualized

Embryo:  Not visualized

Cardiac Activity: Not applicable

MSD: 6 mm   5 w   2 d

CRL: Not applicable

Subchorionic hemorrhage:  None visualized.

Maternal uterus/adnexae: The uterus is anteverted but retroflexed.
The cervix is unremarkable and is closed. No intrauterine masses are
seen. The maternal ovaries are unremarkable save for a corpus luteum
noted within the left ovary. No free fluid seen within the
cul-de-sac.
IMPRESSION: Intrauterine gestational sac. Non visualization of the yolk sac
roughly estimates the gestational between 5.0 and 5.5 weeks.
Follow-up sonography is recommended in 10-14 days to document
appropriate progression and better age the gestation.

## 2022-10-01 DIAGNOSIS — Z419 Encounter for procedure for purposes other than remedying health state, unspecified: Secondary | ICD-10-CM | POA: Diagnosis not present

## 2022-10-31 DIAGNOSIS — Z419 Encounter for procedure for purposes other than remedying health state, unspecified: Secondary | ICD-10-CM | POA: Diagnosis not present

## 2022-11-09 ENCOUNTER — Ambulatory Visit: Payer: Self-pay | Admitting: *Deleted

## 2022-11-09 NOTE — Telephone Encounter (Signed)
Reason for Disposition . All OTHER POTENTIALLY POISONOUS SUBSTANCES (e.g., nearly all chemicals, plants)  (Exception: Known harmless substances or asymptomatic double dose of OTC drug.)  Answer Assessment - Initial Assessment Questions 1. SUBSTANCE: "What was swallowed?" If necessary, have the caller look at the product or drug label on the container to determine active ingredients.     Blue laundry scent- dots 2. AMOUNT: "How much was swallowed?" (e.g., what was the possible maximum amount)      Only one- spit some out when realized- but did swallow some 3. ONSET: "When was it probably swallowed?" (Minutes or hours ago)      15 minutes ago 4. SYMPTOMS: "Do you have any symptoms?" If Yes, ask: "What are they?" (e.g., abdomen pain, vomiting, weakness)      no 5. TREATMENT: "Have you done anything to treat this?" If Yes, ask: "What did you do?"     Spit out- advised rinse mouth 6. SUICIDAL: "Did you take this to hurt or kill yourself?"     no 7. PREGNANCY: "Is there any chance you are pregnant?" "When was your last menstrual period?"     no  Protocols used: Poisoning-A-AH

## 2022-11-09 NOTE — Telephone Encounter (Signed)
  Chief Complaint: swallowed laundry scent- dot Symptoms: none Frequency: one dot swallowed Pertinent Negatives: Patient denies suicidal intention, symptoms Disposition: [] ED /[] Urgent Care (no appt availability in office) / [] Appointment(In office/virtual)/ []  [facility]/ [] Home Care/ [] Refused Recommended Disposition /[] [facility]/ []  Follow-up with PCP - see below Additional Notes: Patient advised per protocol- call poison control for advice- if advised ED then follow that instruction.

## 2022-11-21 DIAGNOSIS — Z7151 Drug abuse counseling and surveillance of drug abuser: Secondary | ICD-10-CM | POA: Diagnosis not present

## 2022-11-24 DIAGNOSIS — Z7151 Drug abuse counseling and surveillance of drug abuser: Secondary | ICD-10-CM | POA: Diagnosis not present

## 2022-11-30 DIAGNOSIS — Z7151 Drug abuse counseling and surveillance of drug abuser: Secondary | ICD-10-CM | POA: Diagnosis not present

## 2023-09-11 DIAGNOSIS — Z419 Encounter for procedure for purposes other than remedying health state, unspecified: Secondary | ICD-10-CM | POA: Diagnosis not present

## 2023-09-14 DIAGNOSIS — F17210 Nicotine dependence, cigarettes, uncomplicated: Secondary | ICD-10-CM | POA: Diagnosis not present

## 2023-09-15 DIAGNOSIS — F17210 Nicotine dependence, cigarettes, uncomplicated: Secondary | ICD-10-CM | POA: Diagnosis not present

## 2023-09-17 DIAGNOSIS — F17210 Nicotine dependence, cigarettes, uncomplicated: Secondary | ICD-10-CM | POA: Diagnosis not present

## 2023-09-22 DIAGNOSIS — F17210 Nicotine dependence, cigarettes, uncomplicated: Secondary | ICD-10-CM | POA: Diagnosis not present

## 2023-09-29 DIAGNOSIS — F17210 Nicotine dependence, cigarettes, uncomplicated: Secondary | ICD-10-CM | POA: Diagnosis not present

## 2023-10-06 DIAGNOSIS — F17210 Nicotine dependence, cigarettes, uncomplicated: Secondary | ICD-10-CM | POA: Diagnosis not present

## 2023-10-12 DIAGNOSIS — Z419 Encounter for procedure for purposes other than remedying health state, unspecified: Secondary | ICD-10-CM | POA: Diagnosis not present

## 2023-10-13 DIAGNOSIS — F17210 Nicotine dependence, cigarettes, uncomplicated: Secondary | ICD-10-CM | POA: Diagnosis not present

## 2023-10-20 DIAGNOSIS — F17210 Nicotine dependence, cigarettes, uncomplicated: Secondary | ICD-10-CM | POA: Diagnosis not present

## 2023-10-27 DIAGNOSIS — F17210 Nicotine dependence, cigarettes, uncomplicated: Secondary | ICD-10-CM | POA: Diagnosis not present

## 2023-11-03 DIAGNOSIS — F17210 Nicotine dependence, cigarettes, uncomplicated: Secondary | ICD-10-CM | POA: Diagnosis not present

## 2023-11-10 DIAGNOSIS — F17210 Nicotine dependence, cigarettes, uncomplicated: Secondary | ICD-10-CM | POA: Diagnosis not present
